# Patient Record
Sex: Male | Born: 1970 | Race: Black or African American | Hispanic: No | Marital: Married | Smoking: Current some day smoker
Health system: Southern US, Community
[De-identification: ages and names within clinical notes are randomized; demographics above are authoritative.]

## PROBLEM LIST (undated history)

## (undated) DIAGNOSIS — I10 Essential (primary) hypertension: Secondary | ICD-10-CM

---

## 1993-01-25 HISTORY — PX: HAND SURGERY: SHX662

## 2003-01-26 HISTORY — PX: ACHILLES TENDON SURGERY: SHX542

## 2020-02-01 ENCOUNTER — Other Ambulatory Visit: Payer: Self-pay

## 2020-02-01 ENCOUNTER — Emergency Department: Payer: BC Managed Care – PPO

## 2020-02-01 ENCOUNTER — Encounter: Payer: Self-pay | Admitting: Emergency Medicine

## 2020-02-01 ENCOUNTER — Inpatient Hospital Stay
Admission: EM | Admit: 2020-02-01 | Discharge: 2020-02-12 | DRG: 177 | Disposition: A | Discharge status: Home / Self Care | Payer: BC Managed Care – PPO | Attending: Internal Medicine | Admitting: Internal Medicine

## 2020-02-01 DIAGNOSIS — R7401 Elevation of levels of liver transaminase levels: Secondary | ICD-10-CM | POA: Diagnosis present

## 2020-02-01 DIAGNOSIS — E875 Hyperkalemia: Secondary | ICD-10-CM | POA: Diagnosis present

## 2020-02-01 DIAGNOSIS — J9601 Acute respiratory failure with hypoxia: Secondary | ICD-10-CM | POA: Diagnosis present

## 2020-02-01 DIAGNOSIS — D72819 Decreased white blood cell count, unspecified: Secondary | ICD-10-CM | POA: Diagnosis present

## 2020-02-01 DIAGNOSIS — U071 COVID-19: Principal | ICD-10-CM

## 2020-02-01 DIAGNOSIS — A0839 Other viral enteritis: Secondary | ICD-10-CM | POA: Diagnosis present

## 2020-02-01 DIAGNOSIS — N179 Acute kidney failure, unspecified: Secondary | ICD-10-CM | POA: Diagnosis present

## 2020-02-01 DIAGNOSIS — T380X5A Adverse effect of glucocorticoids and synthetic analogues, initial encounter: Secondary | ICD-10-CM | POA: Diagnosis present

## 2020-02-01 DIAGNOSIS — I10 Essential (primary) hypertension: Secondary | ICD-10-CM | POA: Diagnosis present

## 2020-02-01 DIAGNOSIS — J1282 Pneumonia due to coronavirus disease 2019: Secondary | ICD-10-CM | POA: Diagnosis present

## 2020-02-01 DIAGNOSIS — E871 Hypo-osmolality and hyponatremia: Secondary | ICD-10-CM | POA: Diagnosis present

## 2020-02-01 DIAGNOSIS — E663 Overweight: Secondary | ICD-10-CM | POA: Diagnosis present

## 2020-02-01 DIAGNOSIS — Z6829 Body mass index (BMI) 29.0-29.9, adult: Secondary | ICD-10-CM

## 2020-02-01 DIAGNOSIS — R7303 Prediabetes: Secondary | ICD-10-CM | POA: Diagnosis present

## 2020-02-01 HISTORY — DX: Essential (primary) hypertension: I10

## 2020-02-01 LAB — CBC WITH DIFFERENTIAL/PLATELET
Abs Immature Granulocytes: 0.02 10*3/uL (ref 0.00–0.07)
Basophils Absolute: 0 10*3/uL (ref 0.0–0.1)
Basophils Relative: 0 %
Eosinophils Absolute: 0 10*3/uL (ref 0.0–0.5)
Eosinophils Relative: 0 %
HCT: 43.4 % (ref 39.0–52.0)
Hemoglobin: 14.1 g/dL (ref 13.0–17.0)
Immature Granulocytes: 0 %
Lymphocytes Relative: 24 %
Lymphs Abs: 1.7 10*3/uL (ref 0.7–4.0)
MCH: 29.9 pg (ref 26.0–34.0)
MCHC: 32.5 g/dL (ref 30.0–36.0)
MCV: 92.1 fL (ref 80.0–100.0)
Monocytes Absolute: 0.4 10*3/uL (ref 0.1–1.0)
Monocytes Relative: 6 %
Neutro Abs: 4.9 10*3/uL (ref 1.7–7.7)
Neutrophils Relative %: 70 %
Platelets: 224 10*3/uL (ref 150–400)
RBC: 4.71 MIL/uL (ref 4.22–5.81)
RDW: 13.6 % (ref 11.5–15.5)
WBC: 7 10*3/uL (ref 4.0–10.5)
nRBC: 0 % (ref 0.0–0.2)

## 2020-02-01 LAB — COMPREHENSIVE METABOLIC PANEL
ALT: 42 U/L (ref 0–44)
AST: 63 U/L — ABNORMAL HIGH (ref 15–41)
Albumin: 3.5 g/dL (ref 3.5–5.0)
Alkaline Phosphatase: 58 U/L (ref 38–126)
Anion gap: 11 (ref 5–15)
BUN: 17 mg/dL (ref 6–20)
CO2: 26 mmol/L (ref 22–32)
Calcium: 8.5 mg/dL — ABNORMAL LOW (ref 8.9–10.3)
Chloride: 97 mmol/L — ABNORMAL LOW (ref 98–111)
Creatinine, Ser: 1.38 mg/dL — ABNORMAL HIGH (ref 0.61–1.24)
GFR, Estimated: >60 mL/min (ref >60)
Glucose, Bld: 135 mg/dL — ABNORMAL HIGH (ref 70–99)
Potassium: 4.3 mmol/L (ref 3.5–5.1)
Sodium: 134 mmol/L — ABNORMAL LOW (ref 135–145)
Total Bilirubin: 0.7 mg/dL (ref 0.3–1.2)
Total Protein: 7.6 g/dL (ref 6.5–8.1)

## 2020-02-01 LAB — LACTIC ACID, PLASMA: Lactic Acid, Venous: 1.1 mmol/L (ref 0.5–1.9)

## 2020-02-01 LAB — FERRITIN: Ferritin: 790 ng/mL — ABNORMAL HIGH (ref 24–336)

## 2020-02-01 LAB — FIBRINOGEN: Fibrinogen: 623 mg/dL — ABNORMAL HIGH (ref 210–475)

## 2020-02-01 LAB — TRIGLYCERIDES: Triglycerides: 110 mg/dL (ref <150)

## 2020-02-01 LAB — PROCALCITONIN: Procalcitonin: 0.42 ng/mL

## 2020-02-01 LAB — LACTATE DEHYDROGENASE: LDH: 438 U/L — ABNORMAL HIGH (ref 98–192)

## 2020-02-01 MED ORDER — SODIUM CHLORIDE 0.9 % IV SOLN: INTRAVENOUS | Status: DC

## 2020-02-01 MED ORDER — ACETAMINOPHEN 325 MG PO TABS: 650.0000 mg | ORAL_TABLET | Freq: Four times a day (QID) | ORAL | Status: DC | PRN

## 2020-02-01 MED ORDER — DEXAMETHASONE SODIUM PHOSPHATE 10 MG/ML IJ SOLN
8.0000 mg | Freq: Once | INTRAMUSCULAR | Status: AC
  Administered 2020-02-01: 8 mg via INTRAVENOUS
  Filled 2020-02-01: qty 1

## 2020-02-01 MED ORDER — SODIUM CHLORIDE 0.9 % IV SOLN
200.0000 mg | Freq: Once | INTRAVENOUS | Status: AC
  Administered 2020-02-01: 200 mg via INTRAVENOUS
  Filled 2020-02-01: qty 200

## 2020-02-01 MED ORDER — SODIUM CHLORIDE 0.9 % IV SOLN
100.0000 mg | Freq: Every day | INTRAVENOUS | Status: AC
  Administered 2020-02-02 – 2020-02-04 (×4): 100 mg via INTRAVENOUS
  Filled 2020-02-01: qty 20
  Filled 2020-02-01: qty 100
  Filled 2020-02-01 (×2): qty 20

## 2020-02-01 MED ORDER — DEXAMETHASONE 6 MG PO TABS
6.0000 mg | ORAL_TABLET | ORAL | Status: DC
  Administered 2020-02-02: 6 mg via ORAL
  Filled 2020-02-01 (×2): qty 1

## 2020-02-01 MED ORDER — ENOXAPARIN SODIUM 40 MG/0.4ML ~~LOC~~ SOLN
40.0000 mg | SUBCUTANEOUS | Status: DC
  Administered 2020-02-01 – 2020-02-11 (×11): 40 mg via SUBCUTANEOUS
  Filled 2020-02-01 (×11): qty 0.4

## 2020-02-01 NOTE — ED Notes (Signed)
Patient stated he started having symptoms on Saturday and they were sore eyes, patient [facility] and tested positive for COVID. Patient then started feeling [facility] and came to ER today Patient is perspiring and is showing some shortness of breath  Patient A&O x 4 patient is appropriate and cooperative.

## 2020-02-01 NOTE — ED Notes (Signed)
This nurse and other nurse unable to draw ordered labs. One set of blood cultures drawn successfully. Will notify EDP and put IV team consult.

## 2020-02-01 NOTE — ED Triage Notes (Signed)
Pt to ED via [facility] from home for shortness of breath. Pt states that he was diagnosed with COVID on Monday. Pt reports that he was having difficulty breathing yesterday but got worse last night. Pt called EMS this morning. Pt is currently on 2 liters of O2 via [place]. Pt states that he has had episodes of syncope due to his breathing. Pt is currently in NAD and able to speak in complete sentences at this time.

## 2020-02-01 NOTE — ED Notes (Signed)
This RN and Barbee Cough, RN are attempting to draw patients labs, patient appears to be very dehydrated

## 2020-02-01 NOTE — ED Provider Notes (Signed)
[facility] Emergency Department Provider Note   ____________________________________________    I have reviewed the triage vital signs and the nursing notes.   HISTORY  Chief Complaint Shortness of Breath and Covid Positive     HPI Jason Cowan is a 50 y.o. male who presents with complaints of shortness of breath.  Patient reports he has been diagnosed with COVID-19.  He has been feeling ill for approximately 7 to 8 days.  He is unvaccinated.  He reports over the last several days he has started to feel short of breath.  Markedly short of breath today, EMS reports he was hypoxic and started him on 2 L nasal cannula with significant improvement.  Patient has not had antibody infusion.  Past Medical History:  Diagnosis Date  . Hypertension     There are no problems to display for this patient.   History reviewed. No pertinent surgical history.  Prior to Admission medications   Not on File     Allergies Patient has no known allergies.  No family history on file.  Social History Social History   Tobacco Use  . Smoking status: Current Every Day Smoker    Types: Cigars  . Smokeless tobacco: Never Used  Substance Use Topics  . Alcohol use: Yes  . Drug use: Not Currently    Review of Systems  Constitutional: Positive fever Eyes: No visual changes.  ENT: No sore throat. Cardiovascular: Denies chest pain. Respiratory: As above. Gastrointestinal: No abdominal pain.  No nausea, no vomiting.   Genitourinary: Negative for dysuria. Musculoskeletal: Myalgias Skin: Negative for rash. Neurological: Negative for headaches or weakness   ____________________________________________   PHYSICAL EXAM:  VITAL SIGNS: ED Triage Vitals  Enc Vitals Group     BP 02/01/20 1124 112/79     Pulse Rate 02/01/20 1124 85     Resp 02/01/20 1124 18     Temp 02/01/20 1124 100.1 F (37.8 C)     Temp src --      SpO2 02/01/20 1124 99 %      Weight 02/01/20 1135 81.6 kg (180 lb)     Height 02/01/20 1135 1.676 m (5\' 6" )     Head Circumference --      Peak Flow --      Pain Score 02/01/20 1134 7     Pain Loc --      Pain Edu? --      Excl. in GC? --     Constitutional: Alert and oriented. No acute distress. Eyes: Conjunctivae are normal.  Head: Atraumatic. Nose: No congestion/rhinnorhea. Mouth/Throat: Mucous membranes are moist.    Cardiovascular: Normal rate, regular rhythm. Grossly normal heart sounds.  Good peripheral circulation. Respiratory: Increased respiratory effort with tachypnea, no retractions, bibasilar Rales Gastrointestinal: Soft and nontender. No distention.  N  Musculoskeletal: No lower extremity tenderness nor edema.  Warm and well perfused Neurologic:  Normal speech and language. No gross focal neurologic deficits are appreciated.  Skin:  Skin is warm, dry and intact. No rash noted. Psychiatric: Mood and affect are normal. Speech and behavior are normal.  ____________________________________________   LABS (all labs ordered are listed, but only abnormal results are displayed)  Labs Reviewed  COMPREHENSIVE METABOLIC PANEL - Abnormal; Notable for the following components:      Result Value   Sodium 134 (*)    Chloride 97 (*)    Glucose, Bld 135 (*)    Creatinine, Ser 1.38 (*)    Calcium  8.5 (*)    AST 63 (*)    All other components within normal limits  CULTURE, BLOOD (ROUTINE X 2)  CULTURE, BLOOD (ROUTINE X 2)  CBC WITH DIFFERENTIAL/PLATELET  LACTIC ACID, PLASMA  LACTIC ACID, PLASMA  FIBRIN DERIVATIVES D-DIMER ([facility] ONLY)  PROCALCITONIN  LACTATE DEHYDROGENASE  FERRITIN  TRIGLYCERIDES  FIBRINOGEN  C-REACTIVE PROTEIN   ____________________________________________  EKG  ED ECG REPORT I, Jene Every, the attending physician, personally viewed and interpreted this ECG.  Date: 02/01/2020  Rhythm: normal sinus rhythm QRS Axis: normal Intervals: normal ST/T Wave abnormalities:  normal Narrative Interpretation: no evidence of acute ischemia  ____________________________________________  RADIOLOGY  Chest x-ray reviewed by me, multifocal pneumonia ____________________________________________   PROCEDURES  Procedure(s) performed: No  Procedures   Critical Care performed: yes  CRITICAL CARE Performed by: Jene Every   Total critical care time: 30 minutes  Critical care time was exclusive of separately billable procedures and treating other patients.  Critical care was necessary to treat or prevent imminent or life-threatening deterioration.  Critical care was time spent personally by me on the following activities: development of treatment plan with patient and/or surrogate as well as nursing, discussions with consultants, evaluation of patient's response to treatment, examination of patient, obtaining history from patient or surrogate, ordering and performing treatments and interventions, ordering and review of laboratory studies, ordering and review of radiographic studies, pulse oximetry and re-evaluation of patient's condition.  ____________________________________________   INITIAL IMPRESSION / ASSESSMENT AND PLAN / ED COURSE  Pertinent labs & imaging results that were available during my care of the patient were reviewed by me and considered in my medical decision making (see chart for details).  Patient presents with shortness of breath, hypoxia with known COVID-19 infection.  Highly suspicious for Covid pneumonia.  No pleurisy to suggest PE, no calf pain or swelling  Lab work demonstrates mild hyponatremia otherwise unremarkable.  X-ray consistent with multifocal pneumonia consistent with COVID-19 pneumonia  Patient is requiring oxygen.  We will give IV Decadron, admit to the hospitalist service    ____________________________________________   FINAL CLINICAL IMPRESSION(S) / ED DIAGNOSES  Final diagnoses:  Acute hypoxemic  respiratory failure due to COVID-19 [facility])        Note:  This document was prepared using Dragon voice recognition software and may include unintentional dictation errors.   Jene Every, MD 02/01/20 1434

## 2020-02-01 NOTE — Consult Note (Signed)
Remdesivir - Pharmacy Brief Note   O:  ALT: 42 (02/01/20) CXR: Moderate severity bilateral viral COVID pneumonia. SpO2: 96% on 2L [place]   A/P:  Remdesivir 200 mg IVPB once followed by 100 mg IVPB daily x 4 days.   Martyn Malay, [facility] 02/01/2020 3:44 PM

## 2020-02-01 NOTE — H&P (Addendum)
History and Physical    Jason Cowan VWP:[id number] DOB: [birth year] DOA: 02/01/2020  PCP: No primary care provider on file.  Patient coming from: home   Chief Complaint: shortness of breat  HPI: Jason Cowan is a 50 y.o. male with medical history significant for htn presenting w/ the above.  Unvaccinated  Symptoms began 1/1.  Initially eye pain and headache. Progressed to body aches, cough, and loose stools. For past few days increasing shortness of breath which prompted him to come here. No vomiting, is tolerating liquids. No leg swelling or chest pain. No abdominal pain. Occasional cigar use, otherwise denies toxic habits. Denies wheeze or history asthma/copd. [facility] (results visible in care everywhere)  ED Course:   EMS reports O2 of 87%, improved to normal w/ 2 L. Labs drawn, decadron ordered. CXR consistent w/ covid pneumonia.   Review of Systems: As per HPI otherwise 10 point review of systems negative.    Past Medical History:  Diagnosis Date  . Hypertension     History reviewed. No pertinent surgical history.   reports that he has been smoking cigars. He has never used smokeless tobacco. He reports current alcohol use. He reports previous drug use.  No Known Allergies  No family history on file.  Prior to Admission medications   Not on File    Physical Exam: Vitals:   02/01/20 1124 02/01/20 1135  BP: 112/79   Pulse: 85   Resp: 18   Temp: 100.1 F (37.8 C)   SpO2: 99%   Weight:  81.6 kg  Height:  5\' 6"  (1.676 m)    Constitutional: No acute distress Head: Atraumatic Eyes: Conjunctiva clear ENM: Moist mucous membranes. Normal dentition.  Neck: Supple Respiratory: Clear to auscultation bilaterally, no wheezing/rales/rhonchi. Normal respiratory effort. No accessory muscle use. . Cardiovascular: Regular rate and rhythm. No murmurs/rubs/gallops. Abdomen: Non-tender, non-distended. No masses. No rebound or  guarding. Positive bowel sounds. Musculoskeletal: No joint deformity upper and lower extremities. Normal ROM, no contractures. Normal muscle tone.  Skin: No rashes, lesions, or ulcers.  Extremities: No peripheral edema. Palpable peripheral pulses. Neurologic: Alert, moving all 4 extremities. Psychiatric: Normal insight and judgement.   Labs on Admission: I have personally reviewed following labs and imaging studies  CBC: Recent Labs  Lab 02/01/20 1147  WBC 7.0  NEUTROABS 4.9  HGB 14.1  HCT 43.4  MCV 92.1  PLT 224   Basic Metabolic Panel: Recent Labs  Lab 02/01/20 1147  NA 134*  K 4.3  CL 97*  CO2 26  GLUCOSE 135*  BUN 17  CREATININE 1.38*  CALCIUM 8.5*   GFR: Estimated Creatinine Clearance: 64.2 mL/min (A) (by C-G formula based on SCr of 1.38 mg/dL (H)). Liver Function Tests: Recent Labs  Lab 02/01/20 1147  AST 63*  ALT 42  ALKPHOS 58  BILITOT 0.7  PROT 7.6  ALBUMIN 3.5   No results for input(s): LIPASE, AMYLASE in the last 168 hours. No results for input(s): AMMONIA in the last 168 hours. Coagulation Profile: No results for input(s): INR, PROTIME in the last 168 hours. Cardiac Enzymes: No results for input(s): CKTOTAL, CKMB, CKMBINDEX, TROPONINI in the last 168 hours. BNP (last 3 results) No results for input(s): PROBNP in the last 8760 hours. HbA1C: No results for input(s): HGBA1C in the last 72 hours. CBG: No results for input(s): GLUCAP in the last 168 hours. Lipid Profile: No results for input(s): CHOL, HDL, LDLCALC, TRIG, CHOLHDL, LDLDIRECT in the last  72 hours. Thyroid Function Tests: No results for input(s): TSH, T4TOTAL, FREET4, T3FREE, THYROIDAB in the last 72 hours. Anemia Panel: No results for input(s): VITAMINB12, FOLATE, FERRITIN, TIBC, IRON, RETICCTPCT in the last 72 hours. Urine analysis: No results found for: COLORURINE, APPEARANCEUR, LABSPEC, PHURINE, GLUCOSEU, HGBUR, BILIRUBINUR, KETONESUR, PROTEINUR, UROBILINOGEN, NITRITE,  LEUKOCYTESUR  Radiological Exams on Admission: DG Chest 2 View  Result Date: 02/01/2020 CLINICAL DATA:  Short of breath.  COVID infection EXAM: CHEST - 2 VIEW COMPARISON:  None. FINDINGS: Normal cardiac silhouette. Bilateral patchy airspace disease with a lower lobe predominance. No pleural fluid. No pneumothorax. IMPRESSION: Moderate severity bilateral viral COVID pneumonia. Electronically Signed   By: Genevive Bi M.D.   On: 02/01/2020 12:28    EKG: Independently reviewed. nsr  Assessment/Plan Principal Problem:   Acute hypoxemic respiratory failure due to COVID-19 [facility]) Active Problems:   Pneumonia due to COVID-19 virus   HTN (hypertension)   # Covid pneumonia # Acute hypoxic respiratory failure # Elevated AST O2 87% via ems, normalized with 2 L [place]. CXR shows b/l infiltrates consistent w/ covid pneumonia. Labs show mild ast elevation, likely 2/2 acute illness. Hemodynamically stable - decadron and remdesivir ordered - daily inflammatory markers - pulmonary toilet  # Elevated Creatinine 1.15, last available result 1.15 in 2017. Thus not clear if this is aki or ckd, pt does have untreated hypertension. - will give NS @ 125 for 24 hours - trend  # HTN Here bp wnl, ill. Says not on meds at home, doesn't have a pcp - monitor  # Hyperglycemia Mild - f/u a1c  # health care maintenance Not followed by a PCP - check hiv, hcv  DVT prophylaxis: lovenox Code Status: full  Family Communication: friend Jason Cowan updated  Consults called: none    Status is: Inpatient  Remains inpatient appropriate because:severity of illness   Dispo: The patient is from: Home              Anticipated d/c is to: Home              Anticipated d/c date is: 1-3 days              Patient currently is not medically stable to d/c.        Silvano Bilis MD [facility] Pager [phone]  If 7PM-7AM, please contact night-coverage [email or web address] Password [facility]  02/01/2020, 2:53 PM

## 2020-02-02 DIAGNOSIS — J9601 Acute respiratory failure with hypoxia: Secondary | ICD-10-CM | POA: Diagnosis not present

## 2020-02-02 DIAGNOSIS — U071 COVID-19: Secondary | ICD-10-CM | POA: Diagnosis not present

## 2020-02-02 LAB — COMPREHENSIVE METABOLIC PANEL
ALT: 40 U/L (ref 0–44)
AST: 49 U/L — ABNORMAL HIGH (ref 15–41)
Albumin: 3.2 g/dL — ABNORMAL LOW (ref 3.5–5.0)
Alkaline Phosphatase: 58 U/L (ref 38–126)
Anion gap: 11 (ref 5–15)
BUN: 20 mg/dL (ref 6–20)
CO2: 24 mmol/L (ref 22–32)
Calcium: 8.4 mg/dL — ABNORMAL LOW (ref 8.9–10.3)
Chloride: 103 mmol/L (ref 98–111)
Creatinine, Ser: 0.92 mg/dL (ref 0.61–1.24)
GFR, Estimated: >60 mL/min (ref >60)
Glucose, Bld: 154 mg/dL — ABNORMAL HIGH (ref 70–99)
Potassium: 4.7 mmol/L (ref 3.5–5.1)
Sodium: 138 mmol/L (ref 135–145)
Total Bilirubin: 0.6 mg/dL (ref 0.3–1.2)
Total Protein: 7.8 g/dL (ref 6.5–8.1)

## 2020-02-02 LAB — FERRITIN: Ferritin: 874 ng/mL — ABNORMAL HIGH (ref 24–336)

## 2020-02-02 LAB — HEPATITIS C ANTIBODY: HCV Ab: NONREACTIVE

## 2020-02-02 LAB — CBC WITH DIFFERENTIAL/PLATELET
Abs Immature Granulocytes: 0.01 10*3/uL (ref 0.00–0.07)
Basophils Absolute: 0 10*3/uL (ref 0.0–0.1)
Basophils Relative: 0 %
Eosinophils Absolute: 0 10*3/uL (ref 0.0–0.5)
Eosinophils Relative: 0 %
HCT: 46.2 % (ref 39.0–52.0)
Hemoglobin: 14.8 g/dL (ref 13.0–17.0)
Immature Granulocytes: 0 %
Lymphocytes Relative: 40 %
Lymphs Abs: 1.1 10*3/uL (ref 0.7–4.0)
MCH: 29.8 pg (ref 26.0–34.0)
MCHC: 32 g/dL (ref 30.0–36.0)
MCV: 93 fL (ref 80.0–100.0)
Monocytes Absolute: 0.2 10*3/uL (ref 0.1–1.0)
Monocytes Relative: 7 %
Neutro Abs: 1.5 10*3/uL — ABNORMAL LOW (ref 1.7–7.7)
Neutrophils Relative %: 53 %
Platelets: 247 10*3/uL (ref 150–400)
RBC: 4.97 MIL/uL (ref 4.22–5.81)
RDW: 14.1 % (ref 11.5–15.5)
Smear Review: NORMAL
WBC: 2.8 10*3/uL — ABNORMAL LOW (ref 4.0–10.5)
nRBC: 0 % (ref 0.0–0.2)

## 2020-02-02 LAB — C-REACTIVE PROTEIN
CRP: 9.5 mg/dL — ABNORMAL HIGH (ref <1.0)
CRP: 9.6 mg/dL — ABNORMAL HIGH (ref <1.0)

## 2020-02-02 LAB — HIV ANTIBODY (ROUTINE TESTING W REFLEX): HIV Screen 4th Generation wRfx: NONREACTIVE

## 2020-02-02 LAB — MAGNESIUM: Magnesium: 2.7 mg/dL — ABNORMAL HIGH (ref 1.7–2.4)

## 2020-02-02 LAB — HEMOGLOBIN A1C
Hgb A1c MFr Bld: 6.3 % — ABNORMAL HIGH (ref 4.8–5.6)
Mean Plasma Glucose: 134.11 mg/dL

## 2020-02-02 LAB — D-DIMER, QUANTITATIVE
D-Dimer, Quant: 1.63 ug/mL-FEU — ABNORMAL HIGH (ref 0.00–0.50)
D-Dimer, Quant: 1.42 ug/mL-FEU — ABNORMAL HIGH (ref 0.00–0.50)

## 2020-02-02 LAB — PHOSPHORUS: Phosphorus: 3 mg/dL (ref 2.5–4.6)

## 2020-02-02 MED ORDER — GUAIFENESIN ER 600 MG PO TB12: 600.0000 mg | ORAL_TABLET | Freq: Two times a day (BID) | ORAL | Status: DC

## 2020-02-02 MED ORDER — METHYLPREDNISOLONE SODIUM SUCC 125 MG IJ SOLR
80.0000 mg | Freq: Two times a day (BID) | INTRAMUSCULAR | Status: DC
  Administered 2020-02-02 – 2020-02-08 (×12): 80 mg via INTRAVENOUS
  Filled 2020-02-02 (×13): qty 2

## 2020-02-02 MED ORDER — TOCILIZUMAB 400 MG/20ML IV SOLN
8.0000 mg/kg | Freq: Once | INTRAVENOUS | Status: AC
  Administered 2020-02-02: 652 mg via INTRAVENOUS
  Filled 2020-02-02: qty 32.6

## 2020-02-02 MED ORDER — GUAIFENESIN-DM 100-10 MG/5ML PO SYRP
15.0000 mL | ORAL_SOLUTION | ORAL | Status: DC | PRN
  Filled 2020-02-02: qty 15

## 2020-02-02 MED ORDER — METHYLPREDNISOLONE SODIUM SUCC 125 MG IJ SOLR: 80.0000 mg | Freq: Four times a day (QID) | INTRAMUSCULAR | Status: DC

## 2020-02-02 MED ORDER — GUAIFENESIN ER 600 MG PO TB12
600.0000 mg | ORAL_TABLET | Freq: Two times a day (BID) | ORAL | Status: DC
  Administered 2020-02-02 – 2020-02-12 (×20): 600 mg via ORAL
  Filled 2020-02-02 (×20): qty 1

## 2020-02-02 MED ORDER — SODIUM CHLORIDE 0.9 % IV SOLN: INTRAVENOUS | Status: AC

## 2020-02-02 MED ORDER — ALBUTEROL SULFATE HFA 108 (90 BASE) MCG/ACT IN AERS
2.0000 | INHALATION_SPRAY | Freq: Four times a day (QID) | RESPIRATORY_TRACT | Status: DC
  Administered 2020-02-03 – 2020-02-12 (×32): 2 via RESPIRATORY_TRACT
  Filled 2020-02-02 (×2): qty 6.7

## 2020-02-02 MED ORDER — METHYLPREDNISOLONE SODIUM SUCC 40 MG IJ SOLR
40.0000 mg | Freq: Two times a day (BID) | INTRAMUSCULAR | Status: DC
  Administered 2020-02-02: 40 mg via INTRAVENOUS
  Filled 2020-02-02: qty 1

## 2020-02-02 MED ORDER — IPRATROPIUM-ALBUTEROL 0.5-2.5 (3) MG/3ML IN SOLN: 3.0000 mL | RESPIRATORY_TRACT | Status: DC | PRN

## 2020-02-02 NOTE — ED Notes (Signed)
Pt still on 6L oxygen per [place]. Added humidification to oxygen. SPO2 continues to drop to 90% on 6L but is mostly remaining 94-97%.

## 2020-02-02 NOTE — ED Notes (Signed)
Turned oxygen from 3L to 2L. Attending at bedside.

## 2020-02-02 NOTE — ED Notes (Signed)
Pt coughing. Offered cough medicine, pt declined. Provided cool wet washcloth to pt forehead. Pt resting with eyes closed and listening to music on phone. Lights turned down.

## 2020-02-02 NOTE — ED Notes (Signed)
Pt desatted to 82%, still on 6L. Had been coughing. Pt remained in 80s, a few times was able to get back to 91-93% when concentrating to breathe deeply through nose but continued to desaturate into low 80s. Pt placed on nonrebreather mask on 15L, humidified. Pt currently satting 93%. Pt states concern why "PNA not being treated" and that he feels he needs an expectorant to help cough up mucous. Explained difference between viral PNA and bacterial PNA in terms of treatment/antibiotic use. Told pt this nurse will communicate concerns to attending MD.

## 2020-02-02 NOTE — ED Notes (Signed)
Communicated findings and pt concerns from previous note to attending provider, Dr Lanae Boast.

## 2020-02-02 NOTE — ED Notes (Signed)
Provided incentive spirometer for pt to use. Pt concerned about having covid PNA and stated to this nurse that a close friend of his recently died. Instructed pt on how to use IS.

## 2020-02-02 NOTE — ED Notes (Signed)
Messaged attendign re: high BP trends for this pt. Pt states has had high BP for a few years that he is aware of but does not take anything. Counseled pt re: importance of BP control.

## 2020-02-02 NOTE — Progress Notes (Addendum)
PROGRESS NOTE    Jason Cowan  ZES:[id number] DOB: Oct 14, 1970 DOA: 02/01/2020 PCP: Patient, No Pcp Per   Chief Complaint  Patient presents with  . Shortness of Breath  . Covid Positive   Brief Narrative: 50 year old male with history of hypertension, unvaccinated for COVID presented with multiple complaints of body aches cough  onset 1/1. Seen in the ED was hypoxic 87% on room air improved to normal with 2 L, COVID-19 positive and chest x-ray with pneumonia and patient was admitted.   Subjective: CRP pending c/o cough with breathing, short of breath still but some better. No chest pain No leg pain, no nauses or vomiting and tolerating po He is on 3L [place] and pusle ox at 95 at bedside   Assessment & Plan:  Acute hypoxemic respiratory failure due to COVID-19/COVID-19 pneumonia: Continue on remdesivir, change Decadron to Solu-Medrol as  need went from 3 to 6L this afternoon, cont bronchodilators supplemental oxygen.  Procalcitonin slightly up 0.4, MONITOR.Monitor inflammatory markers closely. COVID-19 Labs Recent Labs    02/01/20 2003 02/02/20 0447  DDIMER 1.63*  --   FERRITIN 790* 874*  LDH 438*  --   CRP 9.5*  --    Acute renal failure/Hyponatremia: Likely from volume depletion with diarrhea from COVID infection.  Renal function and sodium improved.  Continue gentle IV fluid hydration until able to take PO WELL.  Leukopenia monitor likely from COVID infection.  HTN: BP is well controlled.  Not on meds currently  Addendnium at 7 pm Patient o2 need continued to increase this evening, now on NRB. Iv steroid increased to  solumedro 80 mg q12hr. I also discussed with him that he has new onset DM given hba1c is elevated putting him at higher risk for covid pneumonia associaed morbidity/mortality. Lab Results  Component Value Date   HGBA1C 6.3 (H) 02/02/2020   I discussed with the patient about Actemra inj- risks  in detials including risk of sepsis, liver injury and  benefits of treating covid pneumonia and he agreed. procal is 0.4 and crp at 9.6.  Night RN NP alerted to monitor closely and low threshold for icu transfer.  I called his fiancee to update with patient's conset- no answer, left message to cal the nursing unit for updates.  The treatment plan and use of medications and known side effects were discussed with patient, they were clearly explained that there is no proven definitive treatment for COVID-19 infection, any medications used here are based on published clinical articles/anecdotal data which are not peer-reviewed or randomized control trials.  Complete risks and long-term side effects are unknown, however in the best clinical judgment they seem to be of some clinical benefit rather than medical risks.  Patient agree with the treatment plan and want to receive the given medications.   Nutrition: Diet Order            Diet regular Room service appropriate? Yes; Fluid consistency: Thin  Diet effective now                 Body mass index is 29.05 kg/m. DVT prophylaxis: enoxaparin (LOVENOX) injection 40 mg Start: 02/01/20 2200 Code Status:   Code Status: Full Code  Family Communication: plan of care discussed with patient at bedside.  Status is: Inpatient Remains inpatient appropriate because:IV treatments appropriate due to intensity of illness or inability to take PO and Inpatient level of care appropriate due to severity of illness  Dispo: The patient is from: Home  Anticipated d/c is to: Home              Anticipated d/c date is: 3 days              Patient currently is not medically stable to d/c.  Consultants:see note  Procedures:see note  Culture/Microbiology    Component Value Date/Time   SDES BLOOD BLOOD RIGHT HAND 02/01/2020 2003   SPECREQUEST  02/01/2020 2003    BOTTLES DRAWN AEROBIC AND ANAEROBIC Blood Culture adequate volume   CULT  02/01/2020 2003    NO GROWTH < 12 HOURS Performed at [facility], [address] [place], [place] [address]    REPTSTATUS PENDING 02/01/2020 2003    Other culture-see note  Medications: Scheduled Meds: . dexamethasone  6 mg Oral Q24H  . enoxaparin (LOVENOX) injection  40 mg Subcutaneous Q24H   Continuous Infusions: . sodium chloride 125 mL/hr at 02/02/20 1120  . remdesivir 100 mg in NS 100 mL Stopped (02/02/20 1127)    Antimicrobials: Anti-infectives (From admission, onward)   Start     Dose/Rate Route Frequency Ordered Stop   02/02/20 1000  remdesivir 100 mg in sodium chloride 0.9 % 100 mL IVPB       "Followed by" Linked Group Details   100 mg 200 mL/hr over 30 Minutes Intravenous Daily 02/01/20 1506 02/06/20 0959   02/01/20 1600  remdesivir 200 mg in sodium chloride 0.9% 250 mL IVPB       "Followed by" Linked Group Details   200 mg 580 mL/hr over 30 Minutes Intravenous Once 02/01/20 1506 02/01/20 2011     Objective: Vitals: Today's Vitals   02/02/20 1100 02/02/20 1300 02/02/20 1400 02/02/20 1420  BP:  (!) 151/105 (!) 153/104   Pulse: 78 77 80 85  Resp: (!) 22 (!) 24 (!) 24   Temp:      TempSrc:      SpO2: 98% 90% 90% 91%  Weight:      Height:      PainSc:        Intake/Output Summary (Last 24 hours) at 02/02/2020 1430 Last data filed at 02/02/2020 0402 Gross per 24 hour  Intake -  Output 700 ml  Net -700 ml   Filed Weights   02/01/20 1135  Weight: 81.6 kg   Weight change:   Intake/Output from previous day: 01/07 0701 - 01/08 0700 In: -  Out: 700 [Urine:700] Intake/Output this shift: No intake/output data recorded.  Examination: General exam: AAOx3, young male, able to speak in full sentence HEENT:Oral mucosa moist, Ear/Nose WNL grossly,dentition normal. Respiratory system: bilaterally crackles+,no wheezing or crackles,no use of accessory muscle, non tender. Cardiovascular system: S1 & S2 +, regular, No JVD. Gastrointestinal system: Abdomen soft, NT,ND, BS+. Nervous System:Alert, awake, moving  extremities and grossly nonfocal Extremities: No edema, distal peripheral pulses palpable.  Skin: No rashes,no icterus. MSK: Normal muscle bulk,tone, power  Data Reviewed: I have personally reviewed following labs and imaging studies CBC: Recent Labs  Lab 02/01/20 1147 02/02/20 0447  WBC 7.0 2.8*  NEUTROABS 4.9 1.5*  HGB 14.1 14.8  HCT 43.4 46.2  MCV 92.1 93.0  PLT 224 247   Basic Metabolic Panel: Recent Labs  Lab 02/01/20 1147 02/02/20 0447  NA 134* 138  K 4.3 4.7  CL 97* 103  CO2 26 24  GLUCOSE 135* 154*  BUN 17 20  CREATININE 1.38* 0.92  CALCIUM 8.5* 8.4*  MG  --  2.7*  PHOS  --  3.0  GFR: Estimated Creatinine Clearance: 96.3 mL/min (by C-G formula based on SCr of 0.92 mg/dL). Liver Function Tests: Recent Labs  Lab 02/01/20 1147 02/02/20 0447  AST 63* 49*  ALT 42 40  ALKPHOS 58 58  BILITOT 0.7 0.6  PROT 7.6 7.8  ALBUMIN 3.5 3.2*   No results for input(s): LIPASE, AMYLASE in the last 168 hours. No results for input(s): AMMONIA in the last 168 hours. Coagulation Profile: No results for input(s): INR, PROTIME in the last 168 hours. Cardiac Enzymes: No results for input(s): CKTOTAL, CKMB, CKMBINDEX, TROPONINI in the last 168 hours. BNP (last 3 results) No results for input(s): PROBNP in the last 8760 hours. HbA1C: Recent Labs    02/02/20 0447  HGBA1C 6.3*   CBG: No results for input(s): GLUCAP in the last 168 hours. Lipid Profile: Recent Labs    02/01/20 2003  TRIG 110   Thyroid Function Tests: No results for input(s): TSH, T4TOTAL, FREET4, T3FREE, THYROIDAB in the last 72 hours. Anemia Panel: Recent Labs    02/01/20 2003 02/02/20 0447  FERRITIN 790* 874*   Sepsis Labs: Recent Labs  Lab 02/01/20 2003  PROCALCITON 0.42  LATICACIDVEN 1.1    Recent Results (from the past 240 hour(s))  Blood Culture (routine x 2)     Status: None (Preliminary result)   Collection Time: 02/01/20  2:44 PM   Specimen: BLOOD  Result Value Ref Range  Status   Specimen Description BLOOD BLOOD LEFT ARM  Final   Special Requests   Final    BOTTLES DRAWN AEROBIC AND ANAEROBIC Blood Culture results may not be optimal due to an inadequate volume of blood received in culture bottles   Culture   Final    NO GROWTH < 24 HOURS Performed at [facility], [address], [place], [place] [address]    Report Status PENDING  Incomplete  Blood Culture (routine x 2)     Status: None (Preliminary result)   Collection Time: 02/01/20  8:03 PM   Specimen: BLOOD  Result Value Ref Range Status   Specimen Description BLOOD BLOOD RIGHT HAND  Final   Special Requests   Final    BOTTLES DRAWN AEROBIC AND ANAEROBIC Blood Culture adequate volume   Culture   Final    NO GROWTH < 12 HOURS Performed at [facility], [address], [place], [place] [address]    Report Status PENDING  Incomplete     Radiology Studies: DG Chest 2 View  Result Date: 02/01/2020 CLINICAL DATA:  Short of breath.  COVID infection EXAM: CHEST - 2 VIEW COMPARISON:  None. FINDINGS: Normal cardiac silhouette. Bilateral patchy airspace disease with a lower lobe predominance. No pleural fluid. No pneumothorax. IMPRESSION: Moderate severity bilateral viral COVID pneumonia. Electronically Signed   By: Genevive Bi M.D.   On: 02/01/2020 12:28     LOS: 1 day   Lanae Boast, MD [facility]  02/02/2020, 2:30 PM

## 2020-02-02 NOTE — ED Notes (Addendum)
Pt lying awake in bed -- flat affect; oriented x4.  Pt tachypnic RR - pt on 15L o2 via NRB (provider aware and has discussed plan with patient to start on ABX Actemra and upgrade to ICU if patient O2 demands increase; pt agreeable with plan)  - lung sounds CTA to posterior bases with very mild crackles to LUL; no coughing noted at this time however pt reports intermittent nonprod cough.  Denies cp; cardiac monitoring maintained; NSR HR 75.  Abdomen soft nontender-- pt has decreased appetite but does take in oral fluids - pitcher of ice water at bedside at this time per pt request.  NS infusing @30ml /hr  to 20G L wrist; dressing dry and intact site free of complications.  Will monitor for acute changes and maintain plan of care.

## 2020-02-02 NOTE — ED Notes (Signed)
Pt removed nasal cannula in attempt to get up and walk to bathroom. Pt desatted to 78% on RA. Reapplied [place], pt was still satting at 80-85% on 3L. Turned up to 6L. Slowly came up into 90s. Currently 100%. Will slowly titrate down back to 3L. Pt sitting on side of bed.

## 2020-02-02 NOTE — ED Notes (Signed)
Messaged attending to discuss the possibility of keeping sodium chloride infusion at 135mL/hr order active. Changed BPO cuff to larger size.Previous BP cuff was too small. BP now reading 134/91.

## 2020-02-03 ENCOUNTER — Inpatient Hospital Stay: Payer: BC Managed Care – PPO

## 2020-02-03 ENCOUNTER — Encounter: Payer: Self-pay | Admitting: Obstetrics and Gynecology

## 2020-02-03 DIAGNOSIS — U071 COVID-19: Secondary | ICD-10-CM | POA: Diagnosis not present

## 2020-02-03 DIAGNOSIS — J9601 Acute respiratory failure with hypoxia: Secondary | ICD-10-CM | POA: Diagnosis not present

## 2020-02-03 LAB — PHOSPHORUS: Phosphorus: 2.7 mg/dL (ref 2.5–4.6)

## 2020-02-03 LAB — CBC WITH DIFFERENTIAL/PLATELET
Abs Immature Granulocytes: 0.05 10*3/uL (ref 0.00–0.07)
Basophils Absolute: 0 10*3/uL (ref 0.0–0.1)
Basophils Relative: 0 %
Eosinophils Absolute: 0 10*3/uL (ref 0.0–0.5)
Eosinophils Relative: 0 %
HCT: 43.4 % (ref 39.0–52.0)
Hemoglobin: 13.9 g/dL (ref 13.0–17.0)
Immature Granulocytes: 1 %
Lymphocytes Relative: 20 %
Lymphs Abs: 1.5 10*3/uL (ref 0.7–4.0)
MCH: 29.9 pg (ref 26.0–34.0)
MCHC: 32 g/dL (ref 30.0–36.0)
MCV: 93.3 fL (ref 80.0–100.0)
Monocytes Absolute: 0.5 10*3/uL (ref 0.1–1.0)
Monocytes Relative: 7 %
Neutro Abs: 5.4 10*3/uL (ref 1.7–7.7)
Neutrophils Relative %: 72 %
Platelets: 270 10*3/uL (ref 150–400)
RBC: 4.65 MIL/uL (ref 4.22–5.81)
RDW: 14.3 % (ref 11.5–15.5)
WBC: 7.5 10*3/uL (ref 4.0–10.5)
nRBC: 0 % (ref 0.0–0.2)

## 2020-02-03 LAB — COMPREHENSIVE METABOLIC PANEL
ALT: 43 U/L (ref 0–44)
AST: 45 U/L — ABNORMAL HIGH (ref 15–41)
Albumin: 3 g/dL — ABNORMAL LOW (ref 3.5–5.0)
Alkaline Phosphatase: 51 U/L (ref 38–126)
Anion gap: 9 (ref 5–15)
BUN: 25 mg/dL — ABNORMAL HIGH (ref 6–20)
CO2: 25 mmol/L (ref 22–32)
Calcium: 8.2 mg/dL — ABNORMAL LOW (ref 8.9–10.3)
Chloride: 104 mmol/L (ref 98–111)
Creatinine, Ser: 0.94 mg/dL (ref 0.61–1.24)
GFR, Estimated: >60 mL/min (ref >60)
Glucose, Bld: 158 mg/dL — ABNORMAL HIGH (ref 70–99)
Potassium: 4.9 mmol/L (ref 3.5–5.1)
Sodium: 138 mmol/L (ref 135–145)
Total Bilirubin: 0.7 mg/dL (ref 0.3–1.2)
Total Protein: 7.1 g/dL (ref 6.5–8.1)

## 2020-02-03 LAB — D-DIMER, QUANTITATIVE: D-Dimer, Quant: 1.08 ug/mL-FEU — ABNORMAL HIGH (ref 0.00–0.50)

## 2020-02-03 LAB — CBG MONITORING, ED: Glucose-Capillary: 134 mg/dL — ABNORMAL HIGH (ref 70–99)

## 2020-02-03 LAB — MAGNESIUM: Magnesium: 3.2 mg/dL — ABNORMAL HIGH (ref 1.7–2.4)

## 2020-02-03 LAB — FERRITIN: Ferritin: 741 ng/mL — ABNORMAL HIGH (ref 24–336)

## 2020-02-03 LAB — C-REACTIVE PROTEIN: CRP: 3.2 mg/dL — ABNORMAL HIGH (ref <1.0)

## 2020-02-03 IMAGING — CT CT ANGIO CHEST
2 of 6 series · 18 of 46 positions shown · IV contrast (omnipaque)
Comparison: None.

CLINICAL DATA: Shortness of breath.  COVID positive.

EXAM:
CT ANGIOGRAPHY CHEST WITH CONTRAST
TECHNIQUE: Multidetector CT imaging of the chest was performed using the
standard protocol during bolus administration of intravenous
contrast. Multiplanar CT image reconstructions and MIPs were
obtained to evaluate the vascular anatomy.
CONTRAST:  100mL OMNIPAQUE IOHEXOL 350 MG/ML SOLN

[Series 5: thins · axial · 0.65mm/px · z∈[-470,-229]mm · 16 of 265 slices shown]
[im 12/265  lung]
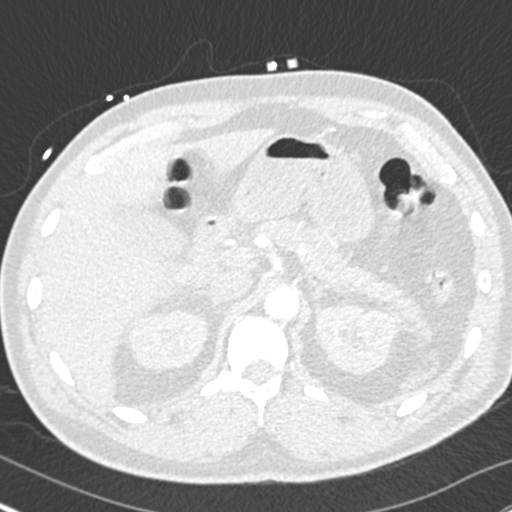
[im 35/265  soft-tissue]
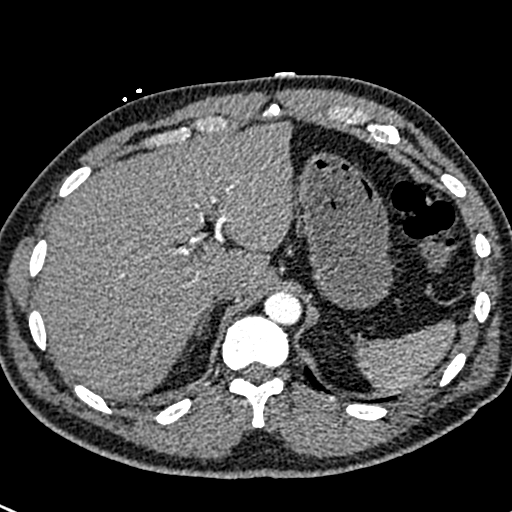
[im 46/265  lung]
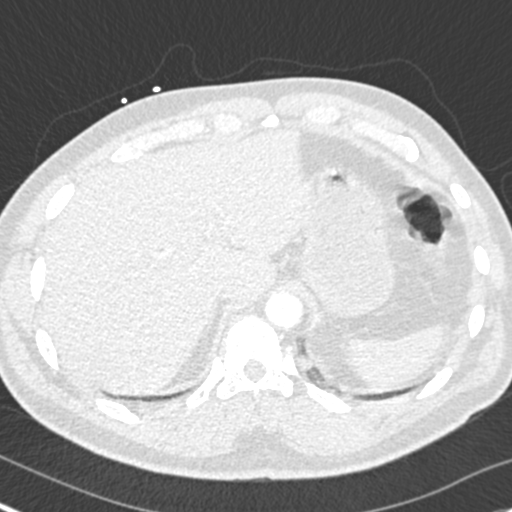
[im 58/265  soft-tissue]
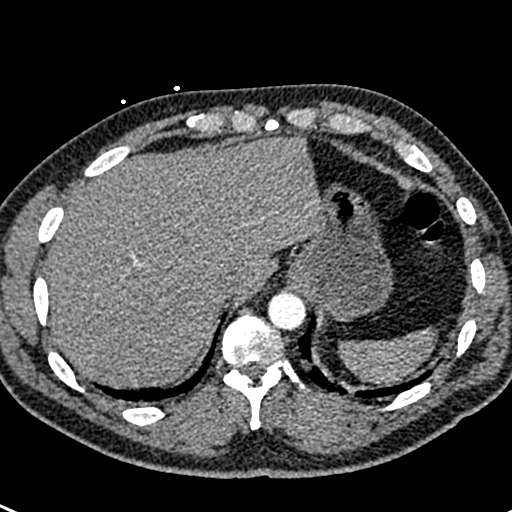
[im 81/265  lung]
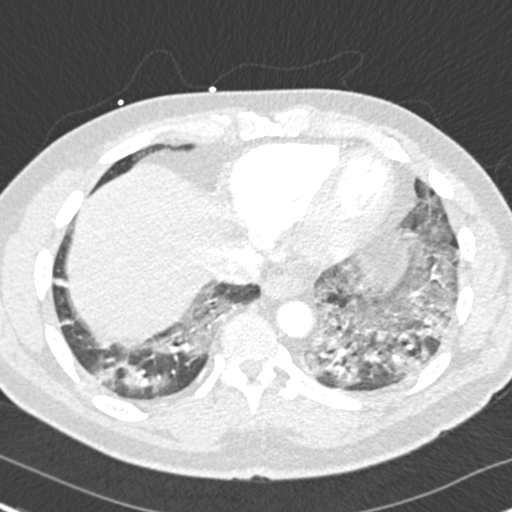
[im 92/265  soft-tissue]
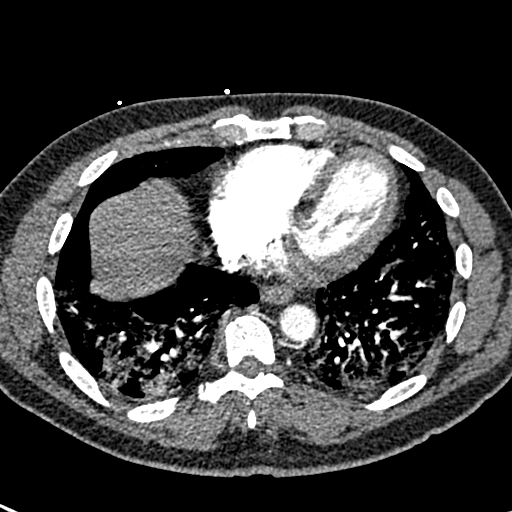
[im 104/265  lung]
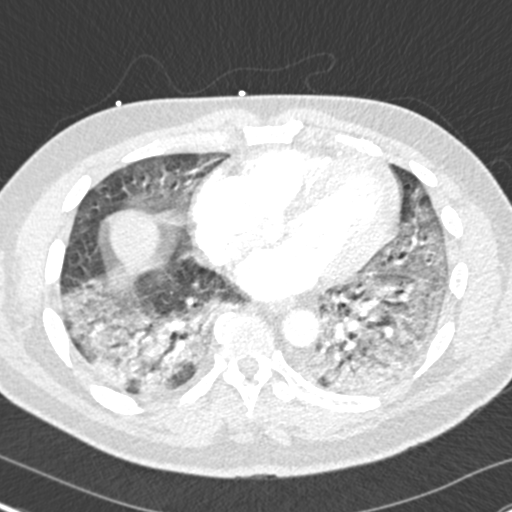
[im 127/265  soft-tissue]
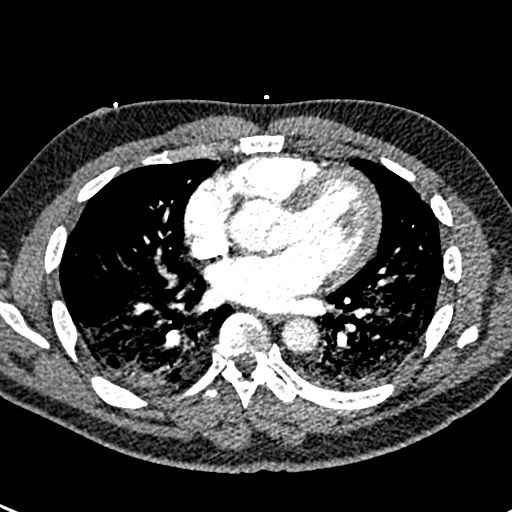
[im 138/265  lung]
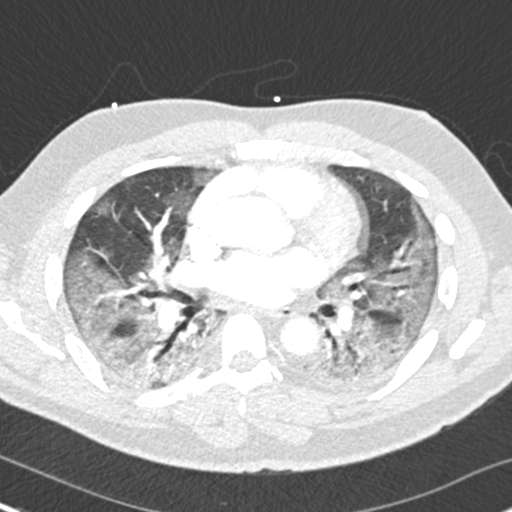
[im 161/265  soft-tissue]
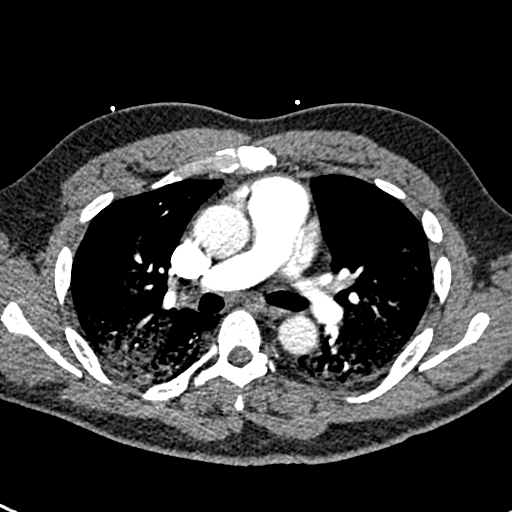
[im 173/265  lung]
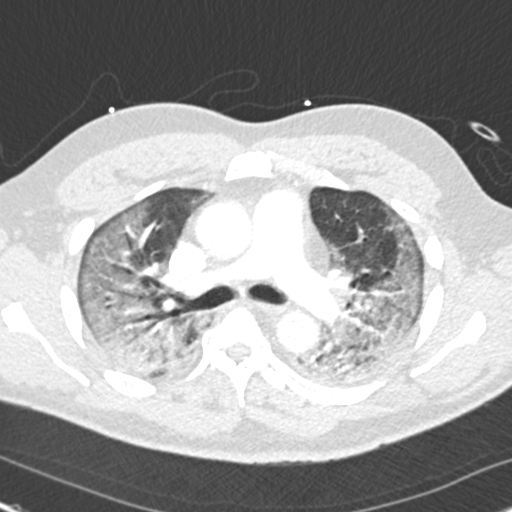
[im 184/265  soft-tissue]
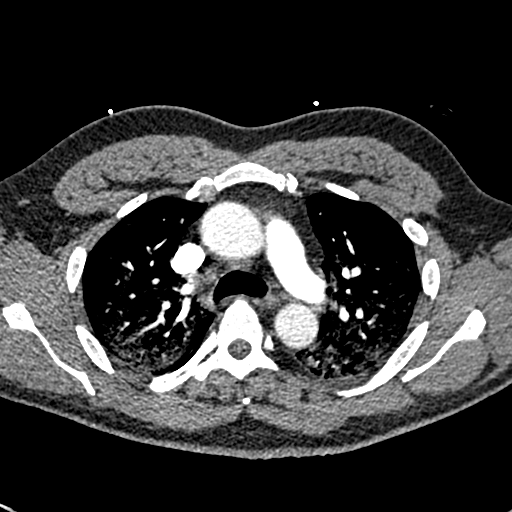
[im 207/265  lung]
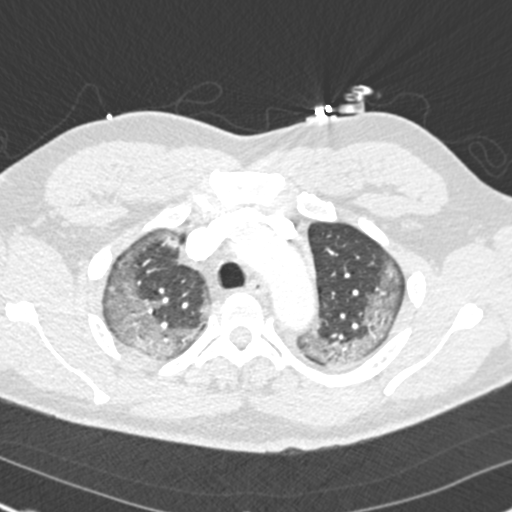
[im 219/265  soft-tissue]
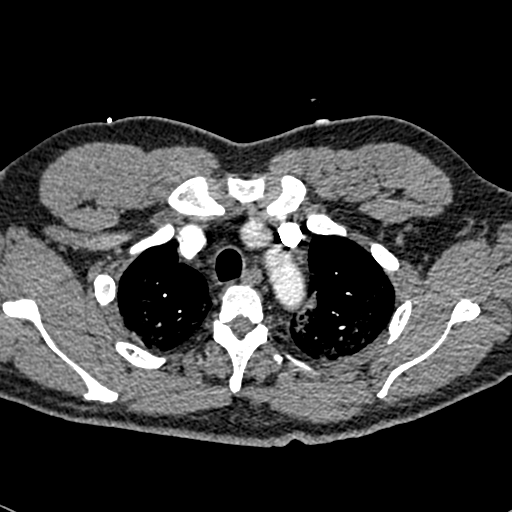
[im 230/265  lung]
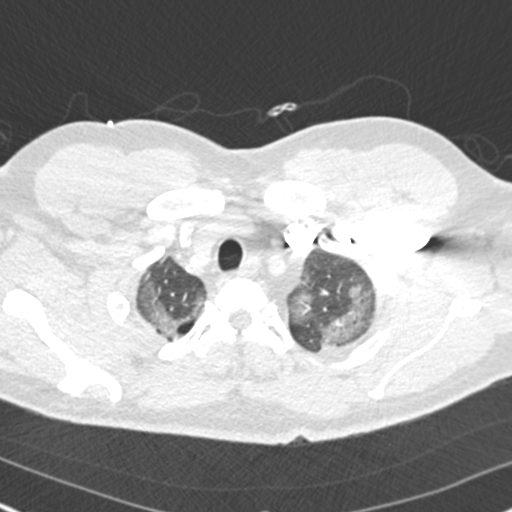
[im 253/265  soft-tissue]
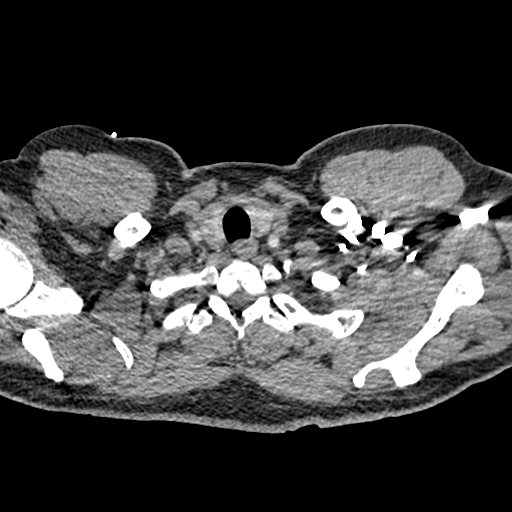

[Series 7: coronal mpr · coronal · 0.52mm/px · 2 of 84 slices shown]
[im 28/84  soft-tissue]
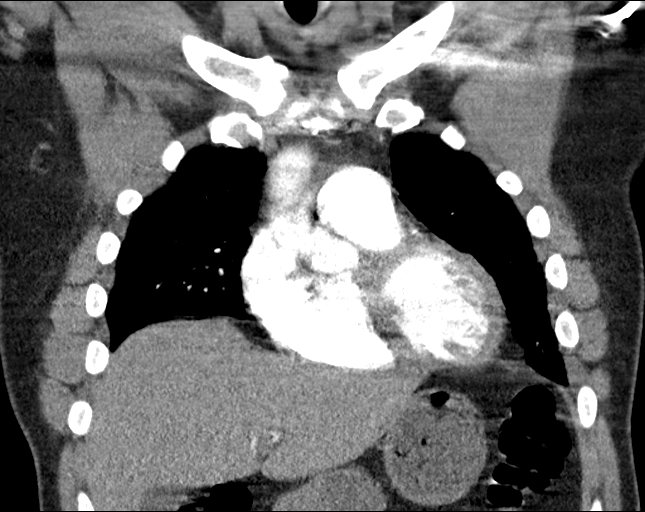
[im 56/84  soft-tissue]
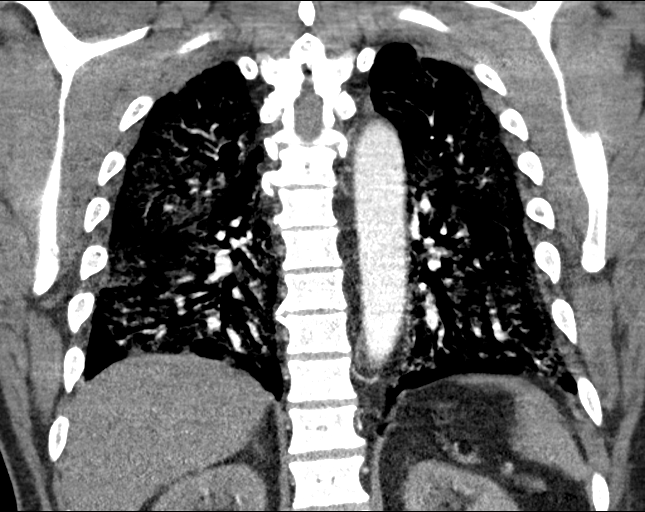

[18 of 46 positions shown; findings below may reference images not displayed]

FINDINGS: Cardiovascular: Evaluation is limited secondary to respiratory
motion, particularly in the bases. No pulmonary embolism through the
segmental pulmonary arteries. Heart is at the upper limits of normal
in size. LEFT-sided coronary artery atherosclerotic calcifications.

Mediastinum/Nodes: Thyroid is unremarkable. No axillary adenopathy.
No suspicious mediastinal adenopathy.

Lungs/Pleura: No pleural effusion or pneumothorax. There are diffuse
bilateral peripheral predominant ground-glass opacities which are
nearly completely confluent within the bilateral lower lobes.

Upper Abdomen: No acute abnormality.

Musculoskeletal: No acute osseous abnormality.

Review of the MIP images confirms the above findings.
IMPRESSION: 1. No evidence of acute pulmonary embolism through the segmental
pulmonary arteries.
2. Diffuse bilateral peripheral predominant ground-glass opacities
which are nearly completely confluent within the bilateral lower
lobes. Findings are consistent with [JS] pneumonia.
3. LEFT-sided coronary artery atherosclerotic calcifications.

Aortic Atherosclerosis ([JS]-[JS]).

## 2020-02-03 MED ORDER — INSULIN ASPART 100 UNIT/ML ~~LOC~~ SOLN
0.0000 [IU] | Freq: Three times a day (TID) | SUBCUTANEOUS | Status: DC
  Administered 2020-02-06: 1 [IU] via SUBCUTANEOUS
  Administered 2020-02-08 – 2020-02-10 (×5): 2 [IU] via SUBCUTANEOUS
  Administered 2020-02-10: 1 [IU] via SUBCUTANEOUS
  Administered 2020-02-10 – 2020-02-12 (×4): 2 [IU] via SUBCUTANEOUS
  Filled 2020-02-03 (×12): qty 1

## 2020-02-03 MED ORDER — IOHEXOL 350 MG/ML SOLN
100.0000 mL | Freq: Once | INTRAVENOUS | Status: AC | PRN
  Administered 2020-02-03: 100 mL via INTRAVENOUS
  Filled 2020-02-03: qty 100

## 2020-02-03 MED ORDER — SODIUM CHLORIDE 0.45 % IV SOLN: INTRAVENOUS | Status: DC

## 2020-02-03 NOTE — ED Notes (Addendum)
Phlebotomy to collect a.m. labs - spoke to lab tech via telephone [phone])who will draw labs this a.m.

## 2020-02-03 NOTE — Progress Notes (Addendum)
PROGRESS NOTE    Jason Cowan  RKY:[id number] DOB: Jan 03, 1971 DOA: 02/01/2020 PCP: Patient, No Pcp Per   Chief Complaint  Patient presents with  . Shortness of Breath  . Covid Positive   Brief Narrative: 50 year old male with history of hypertension, unvaccinated for COVID presented with multiple complaints of body aches cough  onset 1/1. Seen in the ED was hypoxic 87% on room air improved to normal with 2 L, COVID-19 positive and chest x-ray showed moderate severity bilateral viral pneumonia and patient was admitted.   Patient was on 3 to nasal cannula in the morning and oxygen demand significantly increased by evening to NRB on 1/8 and patient was given Actemra  Subjective:  Patient has been weaned overnight from nonrebreather to 6 L nasal cannula.   He feels about the same in terms of his breathing. He is going to think abt getting vaccine- has not made his mind yet. Cough is loosening up. Afebrile.   Assessment & Plan:  Acute hypoxemic respiratory failure due to COVID-19/COVID-19 pneumonia: Patient had rapidly progressing respiratory failure with needing nonrebreather by evening, was not tolerating nasal cannula morning.  Status post Actemra x1.  Patient is considered to be high risk given his rapidly worsening respiratory failure and new onset diabetes. He is weaned off from nonrebreather to 6 nasal cannula this morning.  Has ongoing tachypnea. Will obtain CT Angio chest to eval for PEin th esetting of covid. Keep on 0.5 ns gently to minimize nephrotoxicity. Continue IV Solu-Medrol, remdesivir, inhaler, antitussives and respiratory support.  Monitor inflammatory markers as below COVID-19 Labs Recent Labs    02/01/20 2003 02/02/20 0447  DDIMER 1.63* 1.42*  FERRITIN 790* 874*  LDH 438*  --   CRP 9.5* 9.6*   Acute renal failure/Hyponatremia: Likely from volume depletion with diarrhea from COVID infection.  Improved.  KEEP ON IVF.  Leukopenia WBC count improved overnight.  From COVID infection.  HTN: BP well controlled.Not on meds currently  New onset prediabetes hemoglobin A1c found to be 6.3.  Patient informed about the diagnosis.  Monitor CBG ,add ssi No results for input(s): GLUCAP in the last 168 hours.   Mild transaminitis from COVID-pneumonia  Overweight w/ BMI 29  The treatment plan and use of medications and known side effects were discussed with patient, they were clearly explained that there is no proven definitive treatment for COVID-19 infection, any medications used here are based on published clinical articles/anecdotal data which are not peer-reviewed or randomized control trials.  Complete risks and long-term side effects are unknown, however in the best clinical judgment they seem to be of some clinical benefit rather than medical risks.  Patient agree with the treatment plan and want to receive the given medications.   Nutrition: Diet Order            Diet regular Room service appropriate? Yes; Fluid consistency: Thin  Diet effective now                 Body mass index is 29.05 kg/m. DVT prophylaxis: enoxaparin (LOVENOX) injection 40 mg Start: 02/01/20 2200 Code Status:   Code Status: Full Code  Family Communication: plan of care discussed with patient at bedside.  Status is: Inpatient Remains inpatient appropriate because:IV treatments appropriate due to intensity of illness or inability to take PO and Inpatient level of care appropriate due to severity of illness  Dispo: The patient is from: Home  Anticipated d/c is to: Home              Anticipated d/c date is: 3 days              Patient currently is not medically stable to d/c.  Consultants:see note  Procedures:see note  Culture/Microbiology    Component Value Date/Time   SDES BLOOD BLOOD RIGHT HAND 02/01/2020 2003   SPECREQUEST  02/01/2020 2003    BOTTLES DRAWN AEROBIC AND ANAEROBIC Blood Culture adequate volume   CULT  02/01/2020 2003    NO GROWTH 2  DAYS Performed at [facility], [address] [place], [place] [address]    REPTSTATUS PENDING 02/01/2020 2003    Other culture-see note  Medications: Scheduled Meds: . albuterol  2 puff Inhalation Q6H  . enoxaparin (LOVENOX) injection  40 mg Subcutaneous Q24H  . guaiFENesin  600 mg Oral BID  . methylPREDNISolone (SOLU-MEDROL) injection  80 mg Intravenous Q12H   Continuous Infusions: . remdesivir 100 mg in NS 100 mL Stopped (02/02/20 1127)    Antimicrobials: Anti-infectives (From admission, onward)   Start     Dose/Rate Route Frequency Ordered Stop   02/02/20 1000  remdesivir 100 mg in sodium chloride 0.9 % 100 mL IVPB       "Followed by" Linked Group Details   100 mg 200 mL/hr over 30 Minutes Intravenous Daily 02/01/20 1506 02/06/20 0959   02/01/20 1600  remdesivir 200 mg in sodium chloride 0.9% 250 mL IVPB       "Followed by" Linked Group Details   200 mg 580 mL/hr over 30 Minutes Intravenous Once 02/01/20 1506 02/01/20 2011     Objective: Vitals: Today's Vitals   02/02/20 2300 02/03/20 0000 02/03/20 0100 02/03/20 0535  BP: (!) 139/95 (!) 133/96 (!) 141/103 131/83  Pulse: 70 (!) 58 70 69  Resp: (!) 27 (!) 31 20 (!) 24  Temp:    98.7 F (37.1 C)  TempSrc:    Oral  SpO2: 97% 100% 99% 94%  Weight:      Height:      PainSc:        Intake/Output Summary (Last 24 hours) at 02/03/2020 0839 Last data filed at 02/02/2020 1456 Gross per 24 hour  Intake 200 ml  Output 725 ml  Net -525 ml   Filed Weights   02/01/20 1135  Weight: 81.6 kg   Weight change:   Intake/Output from previous day: 01/08 0701 - 01/09 0700 In: 200 [I.V.:200] Out: 725 [Urine:725] Intake/Output this shift: No intake/output data recorded.  Examination: General exam: AAOx3, sick looking NAD, weak appearing. HEENT:Oral mucosa moist, Ear/Nose WNL grossly, dentition normal. Respiratory system: bilaterally crackles on post lungs,no wheezing or crackles,no use of accessory  muscle Cardiovascular system: S1 & S2 +, No JVD,. Gastrointestinal system: Abdomen soft, NT,ND, BS+ Nervous System:Alert, awake, moving extremities and grossly nonfocal Extremities: No edema, distal peripheral pulses palpable.  Skin: No rashes,no icterus. MSK: Normal muscle bulk,tone, power  Data Reviewed: I have personally reviewed following labs and imaging studies CBC: Recent Labs  Lab 02/01/20 1147 02/02/20 0447 02/03/20 0714  WBC 7.0 2.8* 7.5  NEUTROABS 4.9 1.5* 5.4  HGB 14.1 14.8 13.9  HCT 43.4 46.2 43.4  MCV 92.1 93.0 93.3  PLT 224 247 270   Basic Metabolic Panel: Recent Labs  Lab 02/01/20 1147 02/02/20 0447 02/03/20 0714  NA 134* 138 138  K 4.3 4.7 4.9  CL 97* 103 104  CO2 26 24 25   GLUCOSE 135*  154* 158*  BUN 17 20 25*  CREATININE 1.38* 0.92 0.94  CALCIUM 8.5* 8.4* 8.2*  MG  --  2.7* 3.2*  PHOS  --  3.0 2.7   GFR: Estimated Creatinine Clearance: 94.3 mL/min (by C-G formula based on SCr of 0.94 mg/dL). Liver Function Tests: Recent Labs  Lab 02/01/20 1147 02/02/20 0447 02/03/20 0714  AST 63* 49* 45*  ALT 42 40 43  ALKPHOS 58 58 51  BILITOT 0.7 0.6 0.7  PROT 7.6 7.8 7.1  ALBUMIN 3.5 3.2* 3.0*   No results for input(s): LIPASE, AMYLASE in the last 168 hours. No results for input(s): AMMONIA in the last 168 hours. Coagulation Profile: No results for input(s): INR, PROTIME in the last 168 hours. Cardiac Enzymes: No results for input(s): CKTOTAL, CKMB, CKMBINDEX, TROPONINI in the last 168 hours. BNP (last 3 results) No results for input(s): PROBNP in the last 8760 hours. HbA1C: Recent Labs    02/02/20 0447  HGBA1C 6.3*   CBG: No results for input(s): GLUCAP in the last 168 hours. Lipid Profile: Recent Labs    02/01/20 2003  TRIG 110   Thyroid Function Tests: No results for input(s): TSH, T4TOTAL, FREET4, T3FREE, THYROIDAB in the last 72 hours. Anemia Panel: Recent Labs    02/01/20 2003 02/02/20 0447  FERRITIN 790* 874*   Sepsis  Labs: Recent Labs  Lab 02/01/20 2003  PROCALCITON 0.42  LATICACIDVEN 1.1    Recent Results (from the past 240 hour(s))  Blood Culture (routine x 2)     Status: None (Preliminary result)   Collection Time: 02/01/20  2:44 PM   Specimen: BLOOD  Result Value Ref Range Status   Specimen Description BLOOD BLOOD LEFT ARM  Final   Special Requests   Final    BOTTLES DRAWN AEROBIC AND ANAEROBIC Blood Culture results may not be optimal due to an inadequate volume of blood received in culture bottles   Culture   Final    NO GROWTH 2 DAYS Performed at [facility], [address], [place], [place] [address]    Report Status PENDING  Incomplete  Blood Culture (routine x 2)     Status: None (Preliminary result)   Collection Time: 02/01/20  8:03 PM   Specimen: BLOOD  Result Value Ref Range Status   Specimen Description BLOOD BLOOD RIGHT HAND  Final   Special Requests   Final    BOTTLES DRAWN AEROBIC AND ANAEROBIC Blood Culture adequate volume   Culture   Final    NO GROWTH 2 DAYS Performed at [facility], [address] [place], [place] [address]    Report Status PENDING  Incomplete     Radiology Studies: DG Chest 2 View  Result Date: 02/01/2020 CLINICAL DATA:  Short of breath.  COVID infection EXAM: CHEST - 2 VIEW COMPARISON:  None. FINDINGS: Normal cardiac silhouette. Bilateral patchy airspace disease with a lower lobe predominance. No pleural fluid. No pneumothorax. IMPRESSION: Moderate severity bilateral viral COVID pneumonia. Electronically Signed   By: Genevive Bi M.D.   On: 02/01/2020 12:28     LOS: 2 days   Lanae Boast, MD [facility]  02/03/2020, 8:39 AM

## 2020-02-03 NOTE — ED Notes (Addendum)
Pt lying in bed awake and alert; oriented x4.  NADN.  RR even and  Unlabored on 15L o2 via NRB.  O2 sats 100%.  Pt declines albuterol MDI; states his father used to use one and pt doesn't want it -- attempted to explain benefits for short term treatment for COVID however pt continues to refuse.  Pt also requests bp cuff be removed at this time.  While at bedside this nurse attempting to draw labs from existing saline lock however unsuccessful

## 2020-02-03 NOTE — ED Notes (Signed)
Pt weaned from NRB to nasal cannula @6L   - O2 sats 94%; will continue to monitor for acute changes.  Pt also instructed to call for assistance if any distress - pt acknowledges verbal understanding

## 2020-02-03 NOTE — Evaluation (Signed)
Physical Therapy Evaluation Patient Details Name: Jason Cowan MRN: [id number] DOB: 1970/07/04 Today's Date: 02/03/2020   History of Present Illness  50 yo male with onset of SOB and acute respiratory failure after dx of Covid 19 was brought to ED for medical management.  Pt has been fully independent prior to this, was home until SOB was unmanageable.  Has cough, body aches, hypoxia, Covid PNA, hyperglycemia, elevated creatinine.  PMHx:  elevated BMI, HTN,  Clinical Impression  Pt was seen for initial tolerance to move and desaturation of gait was his main issue.  Pt is at 7L at rest, then elevated to 8 on cannula for gait due to limits of setting on tank.  Pt did not like the increased feeling of air in nostrils, decreased to 6L O2.  Dropped to 75% in recovery and so went to 9L O2 on wall air after 20' walk.  Pt reported he was quite tired out from walking to BR before this.  Pt on 8L at rest to avoid further desaturation as he recovers.  Follow acutely as needed.  Recommend HHPT to follow up but may need to upgrade to SNF or CIR if he loses his strength on LE"s.  Evaluate for AD as needed.    Follow Up Recommendations Home health PT;Supervision for mobility/OOB    Equipment Recommendations  Rolling walker with 5" wheels    Recommendations for Other Services       Precautions / Restrictions Precautions Precautions: Fall Precaution Comments: monitor O2 sats Restrictions Weight Bearing Restrictions: No      Mobility  Bed Mobility Overal bed mobility: Modified Independent                  Transfers Overall transfer level: Needs assistance Equipment used: 1 person hand held assist Transfers: Sit to/from Stand Sit to Stand: Min guard         General transfer comment: min guard as pt is light headed  Ambulation/Gait Ambulation/Gait assistance: Min guard Gait Distance (Feet): 20 Feet Assistive device: 1 person hand held assist Gait Pattern/deviations:  Step-through pattern;Wide base of support;Decreased stride length   Gait velocity interpretation: <1.31 ft/sec, indicative of household ambulator General Gait Details: pt is walking with a controlled pace and note sats drop from 95% baseline on 7L to 75% desaturation with gait  Stairs            Wheelchair Mobility    Modified Rankin (Stroke Patients Only)       Balance Overall balance assessment: Needs assistance Sitting-balance support: Feet supported Sitting balance-Leahy Scale: Fair     Standing balance support: Single extremity supported Standing balance-Leahy Scale: Fair                               Pertinent Vitals/Pain Pain Assessment: No/denies pain    Home Living Family/patient expects to be discharged to:: Private residence Living Arrangements: Alone Available Help at Discharge: Family;Friend(s);Available PRN/intermittently Type of Home: House         Home Equipment: None      Prior Function Level of Independence: Independent         Comments: has been working full time     Hand Dominance   Dominant Hand: Right    Extremity/Trunk Assessment   Upper Extremity Assessment Upper Extremity Assessment: Overall WFL for tasks assessed    Lower Extremity Assessment Lower Extremity Assessment: Overall WFL for tasks assessed  Cervical / Trunk Assessment Cervical / Trunk Assessment: Other exceptions (chronic back and neck pain)  Communication   Communication: No difficulties  Cognition Arousal/Alertness: Awake/alert Behavior During Therapy: Anxious Overall Cognitive Status: Within Functional Limits for tasks assessed                                 General Comments: anxiety over struggle to catch his breath after walking      General Comments General comments (skin integrity, edema, etc.): pt is walked to door and back with sats declining immediately and after walk.  Note his struggle with SOB and left him on  8L O2 for resting post gait since sats were not recovering into 90's again    Exercises     Assessment/Plan    PT Assessment Patient needs continued PT services  PT Problem List Decreased activity tolerance;Decreased balance;Decreased knowledge of use of DME;Cardiopulmonary status limiting activity       PT Treatment Interventions DME instruction;Gait training;Stair training;Functional mobility training;Therapeutic exercise;Therapeutic activities;Neuromuscular re-education;Patient/family education;Balance training    PT Goals (Current goals can be found in the Care Plan section)  Acute Rehab PT Goals Patient Stated Goal: to get better, breathe better PT Goal Formulation: With patient Time For Goal Achievement: 02/17/20 Potential to Achieve Goals: Good    Frequency Min 2X/week   Barriers to discharge Inaccessible home environment;Decreased caregiver support home alone per chart    Co-evaluation               AM-PAC PT "6 Clicks" Mobility  Outcome Measure Help needed turning from your back to your side while in a flat bed without using bedrails?: A Little Help needed moving from lying on your back to sitting on the side of a flat bed without using bedrails?: A Little Help needed moving to and from a bed to a chair (including a wheelchair)?: A Little Help needed standing up from a chair using your arms (e.g., wheelchair or bedside chair)?: A Little Help needed to walk in hospital room?: A Little Help needed climbing 3-5 steps with a railing? : A Little 6 Click Score: 18    End of Session Equipment Utilized During Treatment: Oxygen Activity Tolerance: Patient limited by fatigue;Treatment limited secondary to medical complications (Comment) Patient left: in bed;with call bell/phone within reach Nurse Communication: Mobility status;Other (comment) (desaturation with gait on O2) PT Visit Diagnosis: Unsteadiness on feet (R26.81);Difficulty in walking, not elsewhere  classified (R26.2);Dizziness and giddiness (R42)    Time: 1259-1330 PT Time Calculation (min) (ACUTE ONLY): 31 min   Charges:   PT Evaluation $PT Eval Moderate Complexity: 1 Mod PT Treatments $Gait Training: 8-22 mins       Ivar Drape 02/03/2020, 3:27 PM  Samul Dada, PT MS Acute Rehab Dept. Number: [facility] [id number] and [facility] [phone]

## 2020-02-04 DIAGNOSIS — U071 COVID-19: Secondary | ICD-10-CM | POA: Diagnosis not present

## 2020-02-04 DIAGNOSIS — J9601 Acute respiratory failure with hypoxia: Secondary | ICD-10-CM | POA: Diagnosis not present

## 2020-02-04 LAB — CBC WITH DIFFERENTIAL/PLATELET
Abs Immature Granulocytes: 0.2 10*3/uL — ABNORMAL HIGH (ref 0.00–0.07)
Basophils Absolute: 0 10*3/uL (ref 0.0–0.1)
Basophils Relative: 0 %
Eosinophils Absolute: 0 10*3/uL (ref 0.0–0.5)
Eosinophils Relative: 0 %
HCT: 42.8 % (ref 39.0–52.0)
Hemoglobin: 14.1 g/dL (ref 13.0–17.0)
Immature Granulocytes: 2 %
Lymphocytes Relative: 14 %
Lymphs Abs: 1.5 10*3/uL (ref 0.7–4.0)
MCH: 30.3 pg (ref 26.0–34.0)
MCHC: 32.9 g/dL (ref 30.0–36.0)
MCV: 92 fL (ref 80.0–100.0)
Monocytes Absolute: 0.6 10*3/uL (ref 0.1–1.0)
Monocytes Relative: 5 %
Neutro Abs: 8.8 10*3/uL — ABNORMAL HIGH (ref 1.7–7.7)
Neutrophils Relative %: 79 %
Platelets: 296 10*3/uL (ref 150–400)
RBC: 4.65 MIL/uL (ref 4.22–5.81)
RDW: 14 % (ref 11.5–15.5)
WBC: 11.1 10*3/uL — ABNORMAL HIGH (ref 4.0–10.5)
nRBC: 0 % (ref 0.0–0.2)

## 2020-02-04 LAB — COMPREHENSIVE METABOLIC PANEL
ALT: 111 U/L — ABNORMAL HIGH (ref 0–44)
AST: 117 U/L — ABNORMAL HIGH (ref 15–41)
Albumin: 3.1 g/dL — ABNORMAL LOW (ref 3.5–5.0)
Alkaline Phosphatase: 60 U/L (ref 38–126)
Anion gap: 10 (ref 5–15)
BUN: 22 mg/dL — ABNORMAL HIGH (ref 6–20)
CO2: 25 mmol/L (ref 22–32)
Calcium: 8.3 mg/dL — ABNORMAL LOW (ref 8.9–10.3)
Chloride: 104 mmol/L (ref 98–111)
Creatinine, Ser: 0.96 mg/dL (ref 0.61–1.24)
GFR, Estimated: >60 mL/min (ref >60)
Glucose, Bld: 165 mg/dL — ABNORMAL HIGH (ref 70–99)
Potassium: 4.3 mmol/L (ref 3.5–5.1)
Sodium: 139 mmol/L (ref 135–145)
Total Bilirubin: 0.9 mg/dL (ref 0.3–1.2)
Total Protein: 7 g/dL (ref 6.5–8.1)

## 2020-02-04 LAB — C-REACTIVE PROTEIN: CRP: 1.3 mg/dL — ABNORMAL HIGH (ref <1.0)

## 2020-02-04 LAB — CBG MONITORING, ED
Glucose-Capillary: 123 mg/dL — ABNORMAL HIGH (ref 70–99)
Glucose-Capillary: 133 mg/dL — ABNORMAL HIGH (ref 70–99)

## 2020-02-04 LAB — PHOSPHORUS: Phosphorus: 2.6 mg/dL (ref 2.5–4.6)

## 2020-02-04 LAB — FERRITIN: Ferritin: 650 ng/mL — ABNORMAL HIGH (ref 24–336)

## 2020-02-04 LAB — GLUCOSE, CAPILLARY: Glucose-Capillary: 119 mg/dL — ABNORMAL HIGH (ref 70–99)

## 2020-02-04 LAB — MAGNESIUM: Magnesium: 2.9 mg/dL — ABNORMAL HIGH (ref 1.7–2.4)

## 2020-02-04 LAB — D-DIMER, QUANTITATIVE: D-Dimer, Quant: 3.85 ug/mL-FEU — ABNORMAL HIGH (ref 0.00–0.50)

## 2020-02-04 MED ORDER — THIAMINE HCL 100 MG PO TABS
100.0000 mg | ORAL_TABLET | Freq: Every day | ORAL | Status: DC
  Administered 2020-02-04 – 2020-02-12 (×9): 100 mg via ORAL
  Filled 2020-02-04 (×9): qty 1

## 2020-02-04 MED ORDER — FOLIC ACID 1 MG PO TABS
1.0000 mg | ORAL_TABLET | Freq: Every day | ORAL | Status: DC
  Administered 2020-02-04 – 2020-02-12 (×9): 1 mg via ORAL
  Filled 2020-02-04 (×10): qty 1

## 2020-02-04 MED ORDER — ZINC SULFATE 220 (50 ZN) MG PO CAPS
220.0000 mg | ORAL_CAPSULE | Freq: Every day | ORAL | Status: DC
  Administered 2020-02-04 – 2020-02-12 (×9): 220 mg via ORAL
  Filled 2020-02-04 (×9): qty 1

## 2020-02-04 MED ORDER — ASCORBIC ACID 500 MG PO TABS
500.0000 mg | ORAL_TABLET | Freq: Every day | ORAL | Status: DC
  Administered 2020-02-04 – 2020-02-12 (×8): 500 mg via ORAL
  Filled 2020-02-04 (×8): qty 1

## 2020-02-04 NOTE — ED Notes (Signed)
ED TO INPATIENT HANDOFF REPORT  ED Nurse Name and Phone #: Joseguadalupe Stan [phone]  S Name/Age/Gender Jason Cowan 50 y.o. male Room/Bed: ED33A/ED33A  Code Status   Code Status: Full Code  Home/SNF/Other Home Patient oriented to: self, place, time and situation Is this baseline? Yes   Triage Complete: Triage complete  Chief Complaint Pneumonia due to COVID-19 virus [U07.1, J12.82]  Triage Note Pt to ED via [facility] from home for shortness of breath. Pt states that he was diagnosed with COVID on Monday. Pt reports that he was having difficulty breathing yesterday but got worse last night. Pt called EMS this morning. Pt is currently on 2 liters of O2 via [place]. Pt states that he has had episodes of syncope due to his breathing. Pt is currently in NAD and able to speak in complete sentences at this time.     Allergies No Known Allergies  Level of Care/Admitting Diagnosis ED Disposition    ED Disposition Condition Comment   Admit  Hospital Area: [facility] [[id number]]  Level of Care: Med-Surg [16]  Covid Evaluation: Confirmed COVID Positive  Diagnosis: Pneumonia due to COVID-19 virus [[id number]]  Admitting Physician: Erenest Blank  Attending Physician: Kathrynn Running [[id number]]  Estimated length of stay: past midnight tomorrow  Certification:: I certify this patient will need inpatient services for at least 2 midnights       B Medical/Surgery History Past Medical History:  Diagnosis Date  . Hypertension    History reviewed. No pertinent surgical history.   A IV Location/Drains/Wounds Patient Lines/Drains/Airways Status    Active Line/Drains/Airways    Name Placement date Placement time Site Days   Peripheral IV 02/01/20 Left Wrist 02/01/20  2010  Wrist  3   Peripheral IV 02/03/20 Left Antecubital 02/03/20  1344  Antecubital  1          Intake/Output Last 24 hours  Intake/Output Summary (Last 24 hours) at 02/04/2020 1434 Last data  filed at 02/04/2020 0634 Gross per 24 hour  Intake 719.16 ml  Output 575 ml  Net 144.16 ml    Labs/Imaging Results for orders placed or performed during the hospital encounter of 02/01/20 (from the past 48 hour(s))  CBC with Differential/Platelet     Status: None   Collection Time: 02/03/20  7:14 AM  Result Value Ref Range   WBC 7.5 4.0 - 10.5 K/uL   RBC 4.65 4.22 - 5.81 MIL/uL   Hemoglobin 13.9 13.0 - 17.0 g/dL   HCT 70.9 62.8 - 36.6 %   MCV 93.3 80.0 - 100.0 fL   MCH 29.9 26.0 - 34.0 pg   MCHC 32.0 30.0 - 36.0 g/dL   RDW 29.4 76.5 - 46.5 %   Platelets 270 150 - 400 K/uL   nRBC 0.0 0.0 - 0.2 %   Neutrophils Relative % 72 %   Neutro Abs 5.4 1.7 - 7.7 K/uL   Lymphocytes Relative 20 %   Lymphs Abs 1.5 0.7 - 4.0 K/uL   Monocytes Relative 7 %   Monocytes Absolute 0.5 0.1 - 1.0 K/uL   Eosinophils Relative 0 %   Eosinophils Absolute 0.0 0.0 - 0.5 K/uL   Basophils Relative 0 %   Basophils Absolute 0.0 0.0 - 0.1 K/uL   Immature Granulocytes 1 %   Abs Immature Granulocytes 0.05 0.00 - 0.07 K/uL    Comment: Performed at [facility], [address] [place], [place] [address]  Comprehensive metabolic panel     Status:  Abnormal   Collection Time: 02/03/20  7:14 AM  Result Value Ref Range   Sodium 138 135 - 145 mmol/L   Potassium 4.9 3.5 - 5.1 mmol/L   Chloride 104 98 - 111 mmol/L   CO2 25 22 - 32 mmol/L   Glucose, Bld 158 (H) 70 - 99 mg/dL    Comment: Glucose reference range applies only to samples taken after fasting for at least 8 hours.   BUN 25 (H) 6 - 20 mg/dL   Creatinine, Ser 1.610.94 0.61 - 1.24 mg/dL   Calcium 8.2 (L) 8.9 - 10.3 mg/dL   Total Protein 7.1 6.5 - 8.1 g/dL   Albumin 3.0 (L) 3.5 - 5.0 g/dL   AST 45 (H) 15 - 41 U/L   ALT 43 0 - 44 U/L   Alkaline Phosphatase 51 38 - 126 U/L   Total Bilirubin 0.7 0.3 - 1.2 mg/dL   GFR, Estimated >09>60 >60>60 mL/min    Comment: (NOTE) Calculated using the CKD-EPI Creatinine Equation (2021)    Anion gap 9 5 - 15     Comment: Performed at [facility], [address] [place], [place] [address]  C-reactive protein     Status: Abnormal   Collection Time: 02/03/20  7:14 AM  Result Value Ref Range   CRP 3.2 (H) <1.0 mg/dL    Comment: Performed at [facility], [address] [place], [place] [address]  Ferritin     Status: Abnormal   Collection Time: 02/03/20  7:14 AM  Result Value Ref Range   Ferritin 741 (H) 24 - 336 ng/mL    Comment: Performed at [facility], [address] [place], [place] [address]  Magnesium     Status: Abnormal   Collection Time: 02/03/20  7:14 AM  Result Value Ref Range   Magnesium 3.2 (H) 1.7 - 2.4 mg/dL    Comment: Performed at [facility], [address] [place], [place] [address]  Phosphorus     Status: None   Collection Time: 02/03/20  7:14 AM  Result Value Ref Range   Phosphorus 2.7 2.5 - 4.6 mg/dL    Comment: Performed at [facility], [address] [place], [place] [address]  D-dimer, quantitative (not at [facility])     Status: Abnormal   Collection Time: 02/03/20  7:14 AM  Result Value Ref Range   D-Dimer, Quant 1.08 (H) 0.00 - 0.50 ug/mL-FEU    Comment: (NOTE) At the manufacturer cut-off value of 0.5 g/mL FEU, this assay has a negative predictive value of 95-100%.This assay is intended for use in conjunction with a clinical pretest probability (PTP) assessment model to exclude pulmonary embolism (PE) and deep venous thrombosis (DVT) in outpatients suspected of PE or DVT. Results should be correlated with clinical presentation. Performed at [facility], [address] [place], [place] [address]   CBG monitoring, ED     Status: Abnormal   Collection Time: 02/03/20  9:54 PM  Result Value Ref Range   Glucose-Capillary 134 (H) 70 - 99 mg/dL    Comment: Glucose reference range applies only to samples taken after fasting for at least 8 hours.  CBC with Differential/Platelet     Status: Abnormal   Collection  Time: 02/04/20  5:07 AM  Result Value Ref Range   WBC 11.1 (H) 4.0 - 10.5 K/uL   RBC 4.65 4.22 - 5.81 MIL/uL   Hemoglobin 14.1 13.0 - 17.0 g/dL   HCT 29.542.8 28.439.0 -  52.0 %   MCV 92.0 80.0 - 100.0 fL   MCH 30.3 26.0 - 34.0 pg   MCHC 32.9 30.0 - 36.0 g/dL   RDW 16.114.0 09.611.5 - 04.515.5 %   Platelets 296 150 - 400 K/uL   nRBC 0.0 0.0 - 0.2 %   Neutrophils Relative % 79 %   Neutro Abs 8.8 (H) 1.7 - 7.7 K/uL   Lymphocytes Relative 14 %   Lymphs Abs 1.5 0.7 - 4.0 K/uL   Monocytes Relative 5 %   Monocytes Absolute 0.6 0.1 - 1.0 K/uL   Eosinophils Relative 0 %   Eosinophils Absolute 0.0 0.0 - 0.5 K/uL   Basophils Relative 0 %   Basophils Absolute 0.0 0.0 - 0.1 K/uL   Immature Granulocytes 2 %   Abs Immature Granulocytes 0.20 (H) 0.00 - 0.07 K/uL    Comment: Performed at [facility], [address] [place], [place] [address]  Comprehensive metabolic panel     Status: Abnormal   Collection Time: 02/04/20  5:07 AM  Result Value Ref Range   Sodium 139 135 - 145 mmol/L   Potassium 4.3 3.5 - 5.1 mmol/L   Chloride 104 98 - 111 mmol/L   CO2 25 22 - 32 mmol/L   Glucose, Bld 165 (H) 70 - 99 mg/dL    Comment: Glucose reference range applies only to samples taken after fasting for at least 8 hours.   BUN 22 (H) 6 - 20 mg/dL   Creatinine, Ser 1.910.96 0.61 - 1.24 mg/dL   Calcium 8.3 (L) 8.9 - 10.3 mg/dL   Total Protein 7.0 6.5 - 8.1 g/dL   Albumin 3.1 (L) 3.5 - 5.0 g/dL   AST 478117 (H) 15 - 41 U/L   ALT 111 (H) 0 - 44 U/L   Alkaline Phosphatase 60 38 - 126 U/L   Total Bilirubin 0.9 0.3 - 1.2 mg/dL   GFR, Estimated >29>60 >56>60 mL/min    Comment: (NOTE) Calculated using the CKD-EPI Creatinine Equation (2021)    Anion gap 10 5 - 15    Comment: Performed at [facility], [address] [place], [place] [address]  C-reactive protein     Status: Abnormal   Collection Time: 02/04/20  5:07 AM  Result Value Ref Range   CRP 1.3 (H) <1.0 mg/dL    Comment: Performed at [facility],  [address] [place], [place] [address]  Ferritin     Status: Abnormal   Collection Time: 02/04/20  5:07 AM  Result Value Ref Range   Ferritin 650 (H) 24 - 336 ng/mL    Comment: Performed at [facility], [address] [place], [place] [address]  Magnesium     Status: Abnormal   Collection Time: 02/04/20  5:07 AM  Result Value Ref Range   Magnesium 2.9 (H) 1.7 - 2.4 mg/dL    Comment: Performed at [facility], [address] [place], [place] [address]  Phosphorus     Status: None   Collection Time: 02/04/20  5:07 AM  Result Value Ref Range   Phosphorus 2.6 2.5 - 4.6 mg/dL    Comment: Performed at [facility], [address] [place], [place] [address]  D-dimer, quantitative (not at [facility])     Status: Abnormal   Collection Time: 02/04/20  5:07 AM  Result Value Ref Range   D-Dimer, Quant 3.85 (H) 0.00 - 0.50 ug/mL-FEU    Comment: (NOTE) At the manufacturer cut-off value of 0.5  g/mL FEU, this assay has a negative predictive value of 95-100%.This assay is intended for use in conjunction with a clinical pretest probability (PTP) assessment model to exclude pulmonary embolism (PE) and deep venous thrombosis (DVT) in outpatients suspected of PE or DVT. Results should be correlated with clinical presentation. Performed at [facility], [address] [place], [place] [address]   CBG monitoring, ED     Status: Abnormal   Collection Time: 02/04/20 11:13 AM  Result Value Ref Range   Glucose-Capillary 123 (H) 70 - 99 mg/dL    Comment: Glucose reference range applies only to samples taken after fasting for at least 8 hours.   CT ANGIO CHEST PE W OR WO CONTRAST  Result Date: 02/03/2020 CLINICAL DATA:  Shortness of breath.  COVID positive. EXAM: CT ANGIOGRAPHY CHEST WITH CONTRAST TECHNIQUE: Multidetector CT imaging of the chest was performed using the standard protocol during bolus administration of intravenous contrast. Multiplanar CT image  reconstructions and MIPs were obtained to evaluate the vascular anatomy. CONTRAST:  OMNIPAQUE IOHEXOL 350 MG/ML SOLN COMPARISON:  None. FINDINGS: Cardiovascular: Evaluation is limited secondary to respiratory motion, particularly in the bases. No pulmonary embolism through the segmental pulmonary arteries. Heart is at the upper limits of normal in size. LEFT-sided coronary artery atherosclerotic calcifications. Mediastinum/Nodes: Thyroid is unremarkable. No axillary adenopathy. No suspicious mediastinal adenopathy. Lungs/Pleura: No pleural effusion or pneumothorax. There are diffuse bilateral peripheral predominant ground-glass opacities which are nearly completely confluent within the bilateral lower lobes. Upper Abdomen: No acute abnormality. Musculoskeletal: No acute osseous abnormality. Review of the MIP images confirms the above findings. IMPRESSION: 1. No evidence of acute pulmonary embolism through the segmental pulmonary arteries. 2. Diffuse bilateral peripheral predominant ground-glass opacities which are nearly completely confluent within the bilateral lower lobes. Findings are consistent with COVID-19 pneumonia. 3. LEFT-sided coronary artery atherosclerotic calcifications. Aortic Atherosclerosis (ICD10-I70.0). Electronically Signed   By: Meda Klinefelter MD   On: 02/03/2020 16:48    Pending Labs Unresulted Labs (From admission, onward)          Start     Ordered   02/02/20 0500  CBC with Differential/Platelet  Daily,   STAT      02/01/20 1506   02/02/20 0500  Comprehensive metabolic panel  Daily,   STAT      02/01/20 1506   02/02/20 0500  C-reactive protein  Daily,   STAT      02/01/20 1506   02/02/20 0500  Fibrin derivatives D-Dimer ([facility] only)  Daily,   STAT      02/01/20 1506   02/02/20 0500  Ferritin  Daily,   STAT      02/01/20 1506   02/02/20 0500  Magnesium  Daily,   STAT      02/01/20 1506   02/02/20 0500  Phosphorus  Daily,   STAT      02/01/20 1506           Vitals/Pain Today's Vitals   02/04/20 1330 02/04/20 1400 02/04/20 1432 02/04/20 1432  BP:    (!) 149/91  Pulse: 72 74 64 64  Resp: (!) 38 (!) 27 (!) 23 (!) 24  Temp:      TempSrc:      SpO2: 100% 95% 93% 97%  Weight:      Height:      PainSc:    0-No pain    Isolation Precautions Airborne and Contact precautions  Medications Medications  enoxaparin (LOVENOX) injection 40 mg (40 mg Subcutaneous  Given 02/03/20 2133)  acetaminophen (TYLENOL) tablet 650 mg (has no administration in time range)  0.9 %  sodium chloride infusion ( Intravenous Not Given 02/03/20 2147)  guaiFENesin (MUCINEX) 12 hr tablet 600 mg (600 mg Oral Given 02/04/20 1104)  guaiFENesin-dextromethorphan (ROBITUSSIN DM) 100-10 MG/5ML syrup 15 mL (has no administration in time range)  albuterol (VENTOLIN HFA) 108 (90 Base) MCG/ACT inhaler 2 puff (2 puffs Inhalation Not Given 02/04/20 1431)  methylPREDNISolone sodium succinate (SOLU-MEDROL) 125 mg/2 mL injection 80 mg (80 mg Intravenous Given 02/04/20 0659)  insulin aspart (novoLOG) injection 0-9 Units (0 Units Subcutaneous Not Given 02/04/20 1316)  ascorbic acid (VITAMIN C) tablet 500 mg (500 mg Oral Given 02/04/20 1250)  zinc sulfate capsule 220 mg (220 mg Oral Given 02/04/20 1251)  thiamine tablet 100 mg (100 mg Oral Given 02/04/20 1251)  folic acid (FOLVITE) tablet 1 mg (1 mg Oral Given 02/04/20 1250)  dexamethasone (DECADRON) injection 8 mg (8 mg Intravenous Given 02/01/20 1719)  remdesivir 200 mg in sodium chloride 0.9% 250 mL IVPB (0 mg Intravenous Stopped 02/01/20 2011)    Followed by  remdesivir 100 mg in sodium chloride 0.9 % 100 mL IVPB (0 mg Intravenous Stopped 02/04/20 1208)  tocilizumab (ACTEMRA) 8 mg/kg = 652 mg in sodium chloride 0.9 % 100 mL infusion (0 mg Intravenous Stopped 02/02/20 2325)  iohexol (OMNIPAQUE) 350 MG/ML injection 100 mL (100 mLs Intravenous Contrast Given 02/03/20 1621)    Mobility walks Low fall risk   Focused Assessments Cardiac Assessment  Handoff:    No results found for: CKTOTAL, CKMB, CKMBINDEX, TROPONINI Lab Results  Component Value Date   DDIMER 3.85 (H) 02/04/2020   Does the Patient currently have chest pain? No  , Pulmonary Assessment Handoff:  Lung sounds: Bilateral Breath Sounds: Diminished L Breath Sounds: Diminished R Breath Sounds: Diminished O2 Device: Nasal Cannula O2 Flow Rate (L/min): 30 L/min      R Recommendations: See Admitting Provider Note  Report given to:   Additional Notes:

## 2020-02-04 NOTE — ED Notes (Signed)
Pt repositioning self in bed.

## 2020-02-04 NOTE — ED Notes (Signed)
Receiving RN messaged for pt handoff

## 2020-02-04 NOTE — ED Notes (Signed)
Pt alert, on cell phone.  High flow oxygen in place.  Iv in place.

## 2020-02-04 NOTE — Progress Notes (Signed)
PROGRESS NOTE    Jason Cowan  PJA:[id number] DOB: 1970-04-06 DOA: 02/01/2020 PCP: Patient, No Pcp Per   Chief Complaint  Patient presents with  . Shortness of Breath  . Covid Positive   Brief Narrative: 50 year old male with history of hypertension, unvaccinated for COVID presented with multiple complaints of body aches cough  onset 1/1. Seen in the ED was hypoxic 87% on room air improved to normal with 2 L, COVID-19 positive and chest x-ray showed moderate severity bilateral viral pneumonia and patient was admitted.   Patient was on 3 to nasal cannula in the morning and oxygen demand significantly increased by evening to NRB on 1/8 and patient was given Actemra 1/9- o2 was down to 6l 1/10-patient placed on high flow nasal cannula 30 L overnight.  Subjective: Overnight more hypoxic on HFNC at CRP down 3.2 Patient reports his breathing is "about the same" He reports he is mostly dyspneic with minimal activity.   Assessment & Plan:  Acute hypoxemic respiratory failure due to COVID-19/COVID-19 pneumonia: Patient with worsening respiratory failure, with ongoing increasing need for O2.  Now on 30 L high flow nasal cannula.  He is not in acute distress resting comfortably on the bed.  Mostly symptomatic with minimal activity.   S/p Actemra 1/18 night. Patient is considered to be high risk given his rapidly worsening respiratory failure and new onset prediabetes. CTA no PE 1/19.  We will continue IV Solu-Medrol, remdesivir, inhaler, add zinc/vita c, vitamins, cont antitussives and respiratory support. Monitor inflammatory markers as below with CRP improving. COVID-19 Labs Recent Labs    02/01/20 2003 02/02/20 0447 02/03/20 0714 02/04/20 0507  DDIMER 1.63* 1.42* 1.08*  --   FERRITIN 790* 874* 741* 650*  LDH 438*  --   --   --   CRP 9.5* 9.6* 3.2*  --    Acute renal failure/Hyponatremia: Renal function improved.  He is tolerating p.o. we will discontinue IV fluids.    Recent Labs  Lab 02/01/20 1147 02/02/20 0447 02/03/20 0714 02/04/20 0507  BUN 17 20 25* 22*  CREATININE 1.38* 0.92 0.94 0.96   Leukopenia: WBC now uptrending, patient is on a steroid.    HTN: BP is controlled.  Not on medication.    New onset prediabetes hemoglobin A1c found to be 6.3.  Patient informed about the diagnosis.  Monitor CBG cont ssi Recent Labs  Lab 02/03/20 2154  GLUCAP 134*   Mild transaminitis from covid, monitor  Overweight w/ BMI 29  The treatment plan and use of medications and known side effects were discussed with patient, they were clearly explained that there is no proven definitive treatment for COVID-19 infection, any medications used here are based on published clinical articles/anecdotal data which are not peer-reviewed or randomized control trials.  Complete risks and long-term side effects are unknown, however in the best clinical judgment they seem to be of some clinical benefit rather than medical risks.  Patient agree with the treatment plan and want to receive the given medications.   Nutrition: Diet Order            Diet regular Room service appropriate? Yes; Fluid consistency: Thin  Diet effective now                 Body mass index is 29.05 kg/m. DVT prophylaxis: enoxaparin (LOVENOX) injection 40 mg Start: 02/01/20 2200 Code Status:   Code Status: Full Code  Family Communication: plan of care discussed with patient at bedside.  Status  is: Inpatient Remains inpatient appropriate because:IV treatments appropriate due to intensity of illness or inability to take PO and Inpatient level of care appropriate due to severity of illness  Dispo: The patient is from: Home              Anticipated d/c is to: Home              Anticipated d/c date is: 3 days              Patient currently is not medically stable to d/c.  Consultants:see note  Procedures:see note  Culture/Microbiology    Component Value Date/Time   SDES BLOOD BLOOD RIGHT  HAND 02/01/2020 2003   SPECREQUEST  02/01/2020 2003    BOTTLES DRAWN AEROBIC AND ANAEROBIC Blood Culture adequate volume   CULT  02/01/2020 2003    NO GROWTH 2 DAYS Performed at [facility], [address] [place], [place] [address]    REPTSTATUS PENDING 02/01/2020 2003    Other culture-see note  Medications: Scheduled Meds: . albuterol  2 puff Inhalation Q6H  . enoxaparin (LOVENOX) injection  40 mg Subcutaneous Q24H  . guaiFENesin  600 mg Oral BID  . insulin aspart  0-9 Units Subcutaneous TID WC  . methylPREDNISolone (SOLU-MEDROL) injection  80 mg Intravenous Q12H   Continuous Infusions: . sodium chloride 50 mL/hr at 02/04/20 0634  . remdesivir 100 mg in NS 100 mL Stopped (02/03/20 1052)    Antimicrobials: Anti-infectives (From admission, onward)   Start     Dose/Rate Route Frequency Ordered Stop   02/02/20 1000  remdesivir 100 mg in sodium chloride 0.9 % 100 mL IVPB       "Followed by" Linked Group Details   100 mg 200 mL/hr over 30 Minutes Intravenous Daily 02/01/20 1506 02/06/20 0959   02/01/20 1600  remdesivir 200 mg in sodium chloride 0.9% 250 mL IVPB       "Followed by" Linked Group Details   200 mg 580 mL/hr over 30 Minutes Intravenous Once 02/01/20 1506 02/01/20 2011     Objective: Vitals: Today's Vitals   02/04/20 0615 02/04/20 0630 02/04/20 0635 02/04/20 0642  BP:    (!) 141/85  Pulse: 87 85 72 62  Resp: (!) 36 (!) 28 (!) 28 (!) 30  Temp:      TempSrc:      SpO2: (!) 88% (!) 89% 94% 97%  Weight:      Height:      PainSc:        Intake/Output Summary (Last 24 hours) at 02/04/2020 0755 Last data filed at 02/04/2020 0634 Gross per 24 hour  Intake 719.16 ml  Output 575 ml  Net 144.16 ml   Filed Weights   02/01/20 1135  Weight: 81.6 kg   Weight change:   Intake/Output from previous day: 01/09 0701 - 01/10 0700 In: 719.2 [I.V.:619.2; IV Piggyback:100] Out: 575 [Urine:575] Intake/Output this shift: No intake/output data  recorded.  Examination: General exam: AAOx3, on high flow nasal cannula but not in acute distress.  Able to speak in full sentence HEENT:Oral mucosa moist, Ear/Nose WNL grossly, dentition normal. Respiratory system: bilaterally diminished, no use of accessory muscle Cardiovascular system: S1 & S2 +, No JVD,. Gastrointestinal system: Abdomen soft, NT,ND, BS+ Nervous System:Alert, awake, moving extremities and grossly nonfocal Extremities: No edema, distal peripheral pulses palpable.  Skin: No rashes,no icterus. MSK: Normal muscle bulk,tone, power  Data Reviewed: I have personally reviewed following labs and imaging studies CBC: Recent Labs  Lab  02/01/20 1147 02/02/20 0447 02/03/20 0714 02/04/20 0507  WBC 7.0 2.8* 7.5 11.1*  NEUTROABS 4.9 1.5* 5.4 8.8*  HGB 14.1 14.8 13.9 14.1  HCT 43.4 46.2 43.4 42.8  MCV 92.1 93.0 93.3 92.0  PLT 224 247 270 296   Basic Metabolic Panel: Recent Labs  Lab 02/01/20 1147 02/02/20 0447 02/03/20 0714 02/04/20 0507  NA 134* 138 138 139  K 4.3 4.7 4.9 4.3  CL 97* 103 104 104  CO2 26 24 25 25   GLUCOSE 135* 154* 158* 165*  BUN 17 20 25* 22*  CREATININE 1.38* 0.92 0.94 0.96  CALCIUM 8.5* 8.4* 8.2* 8.3*  MG  --  2.7* 3.2* 2.9*  PHOS  --  3.0 2.7 2.6   GFR: Estimated Creatinine Clearance: 92.3 mL/min (by C-G formula based on SCr of 0.96 mg/dL). Liver Function Tests: Recent Labs  Lab 02/01/20 1147 02/02/20 0447 02/03/20 0714 02/04/20 0507  AST 63* 49* 45* 117*  ALT 42 40 43 111*  ALKPHOS 58 58 51 60  BILITOT 0.7 0.6 0.7 0.9  PROT 7.6 7.8 7.1 7.0  ALBUMIN 3.5 3.2* 3.0* 3.1*   No results for input(s): LIPASE, AMYLASE in the last 168 hours. No results for input(s): AMMONIA in the last 168 hours. Coagulation Profile: No results for input(s): INR, PROTIME in the last 168 hours. Cardiac Enzymes: No results for input(s): CKTOTAL, CKMB, CKMBINDEX, TROPONINI in the last 168 hours. BNP (last 3 results) No results for input(s): PROBNP in  the last 8760 hours. HbA1C: Recent Labs    02/02/20 0447  HGBA1C 6.3*   CBG: Recent Labs  Lab 02/03/20 2154  GLUCAP 134*   Lipid Profile: Recent Labs    02/01/20 2003  TRIG 110   Thyroid Function Tests: No results for input(s): TSH, T4TOTAL, FREET4, T3FREE, THYROIDAB in the last 72 hours. Anemia Panel: Recent Labs    02/03/20 0714 02/04/20 0507  FERRITIN 741* 650*   Sepsis Labs: Recent Labs  Lab 02/01/20 2003  PROCALCITON 0.42  LATICACIDVEN 1.1    Recent Results (from the past 240 hour(s))  Blood Culture (routine x 2)     Status: None (Preliminary result)   Collection Time: 02/01/20  2:44 PM   Specimen: BLOOD  Result Value Ref Range Status   Specimen Description BLOOD BLOOD LEFT ARM  Final   Special Requests   Final    BOTTLES DRAWN AEROBIC AND ANAEROBIC Blood Culture results may not be optimal due to an inadequate volume of blood received in culture bottles   Culture   Final    NO GROWTH 2 DAYS Performed at [facility], [address] [place], [place] [address]    Report Status PENDING  Incomplete  Blood Culture (routine x 2)     Status: None (Preliminary result)   Collection Time: 02/01/20  8:03 PM   Specimen: BLOOD  Result Value Ref Range Status   Specimen Description BLOOD BLOOD RIGHT HAND  Final   Special Requests   Final    BOTTLES DRAWN AEROBIC AND ANAEROBIC Blood Culture adequate volume   Culture   Final    NO GROWTH 2 DAYS Performed at [facility], [address] [place], [place] [address]    Report Status PENDING  Incomplete     Radiology Studies: CT ANGIO CHEST PE W OR WO CONTRAST  Result Date: 02/03/2020 CLINICAL DATA:  Shortness of breath.  COVID positive. EXAM: CT ANGIOGRAPHY CHEST WITH CONTRAST TECHNIQUE: Multidetector CT imaging of the chest was performed  using the standard protocol during bolus administration of intravenous contrast. Multiplanar CT image reconstructions and MIPs were obtained to evaluate the  vascular anatomy. CONTRAST:  OMNIPAQUE IOHEXOL 350 MG/ML SOLN COMPARISON:  None. FINDINGS: Cardiovascular: Evaluation is limited secondary to respiratory motion, particularly in the bases. No pulmonary embolism through the segmental pulmonary arteries. Heart is at the upper limits of normal in size. LEFT-sided coronary artery atherosclerotic calcifications. Mediastinum/Nodes: Thyroid is unremarkable. No axillary adenopathy. No suspicious mediastinal adenopathy. Lungs/Pleura: No pleural effusion or pneumothorax. There are diffuse bilateral peripheral predominant ground-glass opacities which are nearly completely confluent within the bilateral lower lobes. Upper Abdomen: No acute abnormality. Musculoskeletal: No acute osseous abnormality. Review of the MIP images confirms the above findings. IMPRESSION: 1. No evidence of acute pulmonary embolism through the segmental pulmonary arteries. 2. Diffuse bilateral peripheral predominant ground-glass opacities which are nearly completely confluent within the bilateral lower lobes. Findings are consistent with COVID-19 pneumonia. 3. LEFT-sided coronary artery atherosclerotic calcifications. Aortic Atherosclerosis (ICD10-I70.0). Electronically Signed   By: Meda Klinefelter MD   On: 02/03/2020 16:48     LOS: 3 days   Lanae Boast, MD [facility]  02/04/2020, 7:55 AM

## 2020-02-05 ENCOUNTER — Other Ambulatory Visit: Payer: Self-pay

## 2020-02-05 DIAGNOSIS — J1282 Pneumonia due to coronavirus disease 2019: Secondary | ICD-10-CM | POA: Diagnosis not present

## 2020-02-05 DIAGNOSIS — J9601 Acute respiratory failure with hypoxia: Secondary | ICD-10-CM | POA: Diagnosis not present

## 2020-02-05 DIAGNOSIS — R7401 Elevation of levels of liver transaminase levels: Secondary | ICD-10-CM | POA: Diagnosis not present

## 2020-02-05 DIAGNOSIS — U071 COVID-19: Secondary | ICD-10-CM | POA: Diagnosis not present

## 2020-02-05 LAB — CBC WITH DIFFERENTIAL/PLATELET
Abs Immature Granulocytes: 0.25 10*3/uL — ABNORMAL HIGH (ref 0.00–0.07)
Basophils Absolute: 0 10*3/uL (ref 0.0–0.1)
Basophils Relative: 0 %
Eosinophils Absolute: 0 10*3/uL (ref 0.0–0.5)
Eosinophils Relative: 0 %
HCT: 44.3 % (ref 39.0–52.0)
Hemoglobin: 14.7 g/dL (ref 13.0–17.0)
Immature Granulocytes: 3 %
Lymphocytes Relative: 9 %
Lymphs Abs: 0.9 10*3/uL (ref 0.7–4.0)
MCH: 30.3 pg (ref 26.0–34.0)
MCHC: 33.2 g/dL (ref 30.0–36.0)
MCV: 91.3 fL (ref 80.0–100.0)
Monocytes Absolute: 0.6 10*3/uL (ref 0.1–1.0)
Monocytes Relative: 6 %
Neutro Abs: 8.4 10*3/uL — ABNORMAL HIGH (ref 1.7–7.7)
Neutrophils Relative %: 82 %
Platelets: 291 10*3/uL (ref 150–400)
RBC: 4.85 MIL/uL (ref 4.22–5.81)
RDW: 13.9 % (ref 11.5–15.5)
WBC: 10.1 10*3/uL (ref 4.0–10.5)
nRBC: 0.2 % (ref 0.0–0.2)

## 2020-02-05 LAB — COMPREHENSIVE METABOLIC PANEL
ALT: 84 U/L — ABNORMAL HIGH (ref 0–44)
AST: 35 U/L (ref 15–41)
Albumin: 3 g/dL — ABNORMAL LOW (ref 3.5–5.0)
Alkaline Phosphatase: 64 U/L (ref 38–126)
Anion gap: 11 (ref 5–15)
BUN: 25 mg/dL — ABNORMAL HIGH (ref 6–20)
CO2: 25 mmol/L (ref 22–32)
Calcium: 8.7 mg/dL — ABNORMAL LOW (ref 8.9–10.3)
Chloride: 101 mmol/L (ref 98–111)
Creatinine, Ser: 1.01 mg/dL (ref 0.61–1.24)
GFR, Estimated: >60 mL/min (ref >60)
Glucose, Bld: 150 mg/dL — ABNORMAL HIGH (ref 70–99)
Potassium: 4.8 mmol/L (ref 3.5–5.1)
Sodium: 137 mmol/L (ref 135–145)
Total Bilirubin: 0.9 mg/dL (ref 0.3–1.2)
Total Protein: 6.7 g/dL (ref 6.5–8.1)

## 2020-02-05 LAB — PHOSPHORUS: Phosphorus: 3.4 mg/dL (ref 2.5–4.6)

## 2020-02-05 LAB — GLUCOSE, CAPILLARY
Glucose-Capillary: 147 mg/dL — ABNORMAL HIGH (ref 70–99)
Glucose-Capillary: 124 mg/dL — ABNORMAL HIGH (ref 70–99)
Glucose-Capillary: 158 mg/dL — ABNORMAL HIGH (ref 70–99)

## 2020-02-05 LAB — C-REACTIVE PROTEIN: CRP: 0.8 mg/dL (ref <1.0)

## 2020-02-05 LAB — MAGNESIUM: Magnesium: 2.8 mg/dL — ABNORMAL HIGH (ref 1.7–2.4)

## 2020-02-05 LAB — FERRITIN: Ferritin: 488 ng/mL — ABNORMAL HIGH (ref 24–336)

## 2020-02-05 LAB — D-DIMER, QUANTITATIVE: D-Dimer, Quant: >20 ug/mL-FEU — ABNORMAL HIGH (ref 0.00–0.50)

## 2020-02-05 MED ORDER — ORAL CARE MOUTH RINSE
15.0000 mL | Freq: Two times a day (BID) | OROMUCOSAL | Status: DC
  Administered 2020-02-06 – 2020-02-12 (×7): 15 mL via OROMUCOSAL

## 2020-02-05 MED ORDER — ALPRAZOLAM 0.25 MG PO TABS
0.2500 mg | ORAL_TABLET | Freq: Once | ORAL | Status: AC
  Administered 2020-02-05: 0.25 mg via ORAL
  Filled 2020-02-05: qty 1

## 2020-02-05 NOTE — Plan of Care (Signed)

## 2020-02-05 NOTE — Progress Notes (Signed)
PROGRESS NOTE    Jason Cowan  IBB:[id number] DOB: March 16, 1970 DOA: 02/01/2020 PCP: Patient, No Pcp Per   Assessment & Plan:   Principal Problem:   Acute hypoxemic respiratory failure due to COVID-19 [facility]) Active Problems:   Pneumonia due to COVID-19 virus   HTN (hypertension)  Acute hypoxemic respiratory failure: secondary to  COVID-19 pneumonia. Continue on IV remdesivir, IV steroids, zinc, vitamin c. S/p actemra 02/02/20. Continue on supplemental oxygen and wean as tolerated. Continue on bronchodilators and encourage incentive spirometry   COVID19 pneumonia: unvaccinated. Management as stated above  AKI: resolved  Leukopenia: resolved   Pre-DM: new onset w/ HbA1C 6.3. Continue on SSI w/ accuchecks   Transaminitis: secondary to COVID19.   DVT prophylaxis: lovenox Code Status: full  Family Communication:  Disposition Plan:  Likely home w/ home health  Status is: Inpatient  Remains inpatient appropriate because:IV treatments appropriate due to intensity of illness or inability to take PO and Inpatient level of care appropriate due to severity of illness   Dispo: The patient is from: Home              Anticipated d/c is to: Home              Anticipated d/c date is: 3 days              Patient currently is not medically stable to d/c.     Consultants:      Procedures:    Antimicrobials:   Subjective: Pt c/o shortness of breath  Objective: Vitals:   02/04/20 2050 02/04/20 2306 02/04/20 2310 02/05/20 0409  BP: (!) 159/97 (!) 153/102  (!) 136/95  Pulse:  78  72  Resp:  18    Temp:  98.4 F (36.9 C)  98.2 F (36.8 C)  TempSrc:  Oral  Oral  SpO2:  90%  90%  Weight:   82.3 kg 85.2 kg  Height:   5\' 6"  (1.676 m)     Intake/Output Summary (Last 24 hours) at 02/05/2020 0828 Last data filed at 02/05/2020 0408 Gross per 24 hour  Intake 240 ml  Output 1550 ml  Net -1310 ml   Filed Weights   02/01/20 1135 02/04/20 2310 02/05/20 0409  Weight: 81.6  kg 82.3 kg 85.2 kg    Examination:  General exam: Appears calm but uncomfortable  Respiratory system: decreased breath sounds b/l  Cardiovascular system: S1 & S2 +. No rubs, gallops or clicks Gastrointestinal system: Abdomen is nondistended, soft and nontender. Normal bowel sounds heard. Central nervous system: Alert and oriented. Moves all 4 extremities  Psychiatry: Judgement and insight appear normal. Flat mood and affect      Data Reviewed: I have personally reviewed following labs and imaging studies  CBC: Recent Labs  Lab 02/01/20 1147 02/02/20 0447 02/03/20 0714 02/04/20 0507  WBC 7.0 2.8* 7.5 11.1*  NEUTROABS 4.9 1.5* 5.4 8.8*  HGB 14.1 14.8 13.9 14.1  HCT 43.4 46.2 43.4 42.8  MCV 92.1 93.0 93.3 92.0  PLT 224 247 270 296   Basic Metabolic Panel: Recent Labs  Lab 02/01/20 1147 02/02/20 0447 02/03/20 0714 02/04/20 0507  NA 134* 138 138 139  K 4.3 4.7 4.9 4.3  CL 97* 103 104 104  CO2 26 24 25 25   GLUCOSE 135* 154* 158* 165*  BUN 17 20 25* 22*  CREATININE 1.38* 0.92 0.94 0.96  CALCIUM 8.5* 8.4* 8.2* 8.3*  MG  --  2.7* 3.2* 2.9*  PHOS  --  3.0  2.7 2.6   GFR: Estimated Creatinine Clearance: 94.3 mL/min (by C-G formula based on SCr of 0.96 mg/dL). Liver Function Tests: Recent Labs  Lab 02/01/20 1147 02/02/20 0447 02/03/20 0714 02/04/20 0507  AST 63* 49* 45* 117*  ALT 42 40 43 111*  ALKPHOS 58 58 51 60  BILITOT 0.7 0.6 0.7 0.9  PROT 7.6 7.8 7.1 7.0  ALBUMIN 3.5 3.2* 3.0* 3.1*   No results for input(s): LIPASE, AMYLASE in the last 168 hours. No results for input(s): AMMONIA in the last 168 hours. Coagulation Profile: No results for input(s): INR, PROTIME in the last 168 hours. Cardiac Enzymes: No results for input(s): CKTOTAL, CKMB, CKMBINDEX, TROPONINI in the last 168 hours. BNP (last 3 results) No results for input(s): PROBNP in the last 8760 hours. HbA1C: No results for input(s): HGBA1C in the last 72 hours. CBG: Recent Labs  Lab  02/03/20 2154 02/04/20 1113 02/04/20 1741 02/04/20 2315  GLUCAP 134* 123* 133* 119*   Lipid Profile: No results for input(s): CHOL, HDL, LDLCALC, TRIG, CHOLHDL, LDLDIRECT in the last 72 hours. Thyroid Function Tests: No results for input(s): TSH, T4TOTAL, FREET4, T3FREE, THYROIDAB in the last 72 hours. Anemia Panel: Recent Labs    02/03/20 0714 02/04/20 0507  FERRITIN 741* 650*   Sepsis Labs: Recent Labs  Lab 02/01/20 2003  PROCALCITON 0.42  LATICACIDVEN 1.1    Recent Results (from the past 240 hour(s))  Blood Culture (routine x 2)     Status: None (Preliminary result)   Collection Time: 02/01/20  2:44 PM   Specimen: BLOOD  Result Value Ref Range Status   Specimen Description BLOOD BLOOD LEFT ARM  Final   Special Requests   Final    BOTTLES DRAWN AEROBIC AND ANAEROBIC Blood Culture results may not be optimal due to an inadequate volume of blood received in culture bottles   Culture   Final    NO GROWTH 4 DAYS Performed at [facility], [address], [place], [place] [address]    Report Status PENDING  Incomplete  Blood Culture (routine x 2)     Status: None (Preliminary result)   Collection Time: 02/01/20  8:03 PM   Specimen: BLOOD  Result Value Ref Range Status   Specimen Description BLOOD BLOOD RIGHT HAND  Final   Special Requests   Final    BOTTLES DRAWN AEROBIC AND ANAEROBIC Blood Culture adequate volume   Culture   Final    NO GROWTH 4 DAYS Performed at [facility], [address] [place], [place] [address]    Report Status PENDING  Incomplete         Radiology Studies: CT ANGIO CHEST PE W OR WO CONTRAST  Result Date: 02/03/2020 CLINICAL DATA:  Shortness of breath.  COVID positive. EXAM: CT ANGIOGRAPHY CHEST WITH CONTRAST TECHNIQUE: Multidetector CT imaging of the chest was performed using the standard protocol during bolus administration of intravenous contrast. Multiplanar CT image reconstructions and MIPs were obtained to  evaluate the vascular anatomy. CONTRAST:  OMNIPAQUE IOHEXOL 350 MG/ML SOLN COMPARISON:  None. FINDINGS: Cardiovascular: Evaluation is limited secondary to respiratory motion, particularly in the bases. No pulmonary embolism through the segmental pulmonary arteries. Heart is at the upper limits of normal in size. LEFT-sided coronary artery atherosclerotic calcifications. Mediastinum/Nodes: Thyroid is unremarkable. No axillary adenopathy. No suspicious mediastinal adenopathy. Lungs/Pleura: No pleural effusion or pneumothorax. There are diffuse bilateral peripheral predominant ground-glass opacities which are nearly completely confluent within the bilateral lower lobes. Upper Abdomen: No  acute abnormality. Musculoskeletal: No acute osseous abnormality. Review of the MIP images confirms the above findings. IMPRESSION: 1. No evidence of acute pulmonary embolism through the segmental pulmonary arteries. 2. Diffuse bilateral peripheral predominant ground-glass opacities which are nearly completely confluent within the bilateral lower lobes. Findings are consistent with COVID-19 pneumonia. 3. LEFT-sided coronary artery atherosclerotic calcifications. Aortic Atherosclerosis (ICD10-I70.0). Electronically Signed   By: Meda Klinefelter MD   On: 02/03/2020 16:48        Scheduled Meds: . albuterol  2 puff Inhalation Q6H  . vitamin C  500 mg Oral Daily  . enoxaparin (LOVENOX) injection  40 mg Subcutaneous Q24H  . folic acid  1 mg Oral Daily  . guaiFENesin  600 mg Oral BID  . insulin aspart  0-9 Units Subcutaneous TID WC  . mouth rinse  15 mL Mouth Rinse BID  . methylPREDNISolone (SOLU-MEDROL) injection  80 mg Intravenous Q12H  . thiamine  100 mg Oral Daily  . zinc sulfate  220 mg Oral Daily   Continuous Infusions:   LOS: 4 days    Time spent: 30 mins     Charise Killian, MD [facility] Pager [phone]   02/05/2020, 8:28 AM

## 2020-02-05 NOTE — Progress Notes (Signed)
PT Cancellation Note  Patient Details Name: Jason Cowan MRN: [id number] DOB: 05/08/1970   Cancelled Treatment:    Reason Eval/Treat Not Completed: Medical issues which prohibited therapy (Chart reviewed for attempted treatment. Upon arrival to room, patient just finished using urinal.  Visibly SOB, sats 72% on HHFNC.  Coached through pursed lip breathing and relaxation with recovery to 80-82%.  Per RN, non -rebreather at 15L applied to patient; RN to call respiratory for additional assessment.  Sats recovered to 87-88% on HHFNC and non-rebreather.  Additional activity deferred at this time.  Will continue to follow and intervene as medically appropriate.)   Shavonda Wiedman H. Manson Passey, PT, DPT, NCS 02/05/20, 2:54 PM [phone]

## 2020-02-05 NOTE — Progress Notes (Signed)
Pt SpO2 noted to be sustaining in mid 80s, desating to as low as high 60s on 30L / 70% FIO@ HHFNC. Pt tachypneic and stated the he felt SOB and anxious. Attempted to have pt relax and focus on his breathing, pts sats inc to ~86% on HHFNC but still dropped down to low 80s, high 70s. Pt then placed on 15L NRB mask @ 15LPM. Pts SPO2 maintaining in mid to low 90s with HHFNC and NRB. RT notified of pts desaturation and application of NRB in addition to [facility]. No further actions at this time, will continue to monitor.

## 2020-02-06 DIAGNOSIS — J9601 Acute respiratory failure with hypoxia: Secondary | ICD-10-CM | POA: Diagnosis not present

## 2020-02-06 DIAGNOSIS — J1282 Pneumonia due to coronavirus disease 2019: Secondary | ICD-10-CM | POA: Diagnosis not present

## 2020-02-06 DIAGNOSIS — U071 COVID-19: Secondary | ICD-10-CM | POA: Diagnosis not present

## 2020-02-06 DIAGNOSIS — R7401 Elevation of levels of liver transaminase levels: Secondary | ICD-10-CM | POA: Diagnosis not present

## 2020-02-06 LAB — CBC WITH DIFFERENTIAL/PLATELET
Abs Immature Granulocytes: 0.44 10*3/uL — ABNORMAL HIGH (ref 0.00–0.07)
Basophils Absolute: 0 10*3/uL (ref 0.0–0.1)
Basophils Relative: 0 %
Eosinophils Absolute: 0 10*3/uL (ref 0.0–0.5)
Eosinophils Relative: 0 %
HCT: 45.7 % (ref 39.0–52.0)
Hemoglobin: 15 g/dL (ref 13.0–17.0)
Immature Granulocytes: 3 %
Lymphocytes Relative: 12 %
Lymphs Abs: 1.6 10*3/uL (ref 0.7–4.0)
MCH: 30 pg (ref 26.0–34.0)
MCHC: 32.8 g/dL (ref 30.0–36.0)
MCV: 91.4 fL (ref 80.0–100.0)
Monocytes Absolute: 0.7 10*3/uL (ref 0.1–1.0)
Monocytes Relative: 6 %
Neutro Abs: 10.2 10*3/uL — ABNORMAL HIGH (ref 1.7–7.7)
Neutrophils Relative %: 79 %
Platelets: 285 10*3/uL (ref 150–400)
RBC: 5 MIL/uL (ref 4.22–5.81)
RDW: 13.8 % (ref 11.5–15.5)
WBC: 13 10*3/uL — ABNORMAL HIGH (ref 4.0–10.5)
nRBC: 0.2 % (ref 0.0–0.2)

## 2020-02-06 LAB — COMPREHENSIVE METABOLIC PANEL
ALT: 64 U/L — ABNORMAL HIGH (ref 0–44)
AST: 22 U/L (ref 15–41)
Albumin: 3.2 g/dL — ABNORMAL LOW (ref 3.5–5.0)
Alkaline Phosphatase: 70 U/L (ref 38–126)
Anion gap: 11 (ref 5–15)
BUN: 28 mg/dL — ABNORMAL HIGH (ref 6–20)
CO2: 26 mmol/L (ref 22–32)
Calcium: 8.6 mg/dL — ABNORMAL LOW (ref 8.9–10.3)
Chloride: 102 mmol/L (ref 98–111)
Creatinine, Ser: 1 mg/dL (ref 0.61–1.24)
GFR, Estimated: >60 mL/min (ref >60)
Glucose, Bld: 152 mg/dL — ABNORMAL HIGH (ref 70–99)
Potassium: 4.8 mmol/L (ref 3.5–5.1)
Sodium: 139 mmol/L (ref 135–145)
Total Bilirubin: 1 mg/dL (ref 0.3–1.2)
Total Protein: 6.8 g/dL (ref 6.5–8.1)

## 2020-02-06 LAB — PHOSPHORUS: Phosphorus: 3.9 mg/dL (ref 2.5–4.6)

## 2020-02-06 LAB — GLUCOSE, CAPILLARY
Glucose-Capillary: 119 mg/dL — ABNORMAL HIGH (ref 70–99)
Glucose-Capillary: 160 mg/dL — ABNORMAL HIGH (ref 70–99)
Glucose-Capillary: 145 mg/dL — ABNORMAL HIGH (ref 70–99)
Glucose-Capillary: 151 mg/dL — ABNORMAL HIGH (ref 70–99)

## 2020-02-06 LAB — CULTURE, BLOOD (ROUTINE X 2)
Culture: NO GROWTH
Culture: NO GROWTH
Special Requests: ADEQUATE

## 2020-02-06 LAB — MAGNESIUM: Magnesium: 2.8 mg/dL — ABNORMAL HIGH (ref 1.7–2.4)

## 2020-02-06 LAB — EXPECTORATED SPUTUM ASSESSMENT W GRAM STAIN, RFLX TO RESP C

## 2020-02-06 LAB — D-DIMER, QUANTITATIVE: D-Dimer, Quant: >20 ug/mL-FEU — ABNORMAL HIGH (ref 0.00–0.50)

## 2020-02-06 LAB — C-REACTIVE PROTEIN: CRP: 0.6 mg/dL (ref <1.0)

## 2020-02-06 LAB — FERRITIN: Ferritin: 489 ng/mL — ABNORMAL HIGH (ref 24–336)

## 2020-02-06 MED ORDER — SALINE SPRAY 0.65 % NA SOLN
1.0000 | NASAL | Status: DC | PRN
  Filled 2020-02-06: qty 44

## 2020-02-06 NOTE — Plan of Care (Signed)

## 2020-02-06 NOTE — Progress Notes (Signed)
Patient has remained Prone most of the day. Oxygenation at or above 96% all day. Poor appetite. Ordered a replacement lunch tray of fruit per patient's request. Sputum Culture collected and sent to lab for analysis. Patient complained of bloody sputum and culture ordered by MD Mayford Knife. Patient has taken all medications today and more energetic today vs yesterday.

## 2020-02-06 NOTE — Progress Notes (Signed)
Physical Therapy Treatment Patient Details Name: Jason Cowan MRN: [id number] DOB: 02/17/1970 Today's Date: 02/06/2020    History of Present Illness 50 yo male with onset of SOB and acute respiratory failure after dx of Covid 19 was brought to ED for medical management.  Pt has been fully independent prior to this, was home until SOB was unmanageable.  Has cough, body aches, hypoxia, Covid PNA, hyperglycemia, elevated creatinine.  PMHx:  elevated BMI, HTN,    PT Comments    Patient resting in bed upon arrival to room; breathing much calmer, more controlled this date.  Agreeable to participation with session; does report some light mobility in room earlier this date. Tolerates approx 8-10 reps of each therex prior to need for rest period to recover breath support.  Sats fluctuate 78-90% throughout session on HHFNC, but recovers appropriately with intermittent rest periods.  Moderate anxiety noted as SOB increases; may benefit from coordinated anti-anxiety medications with therapy in subsequent sessions.     Follow Up Recommendations  Home health PT     Equipment Recommendations       Recommendations for Other Services       Precautions / Restrictions Precautions Precautions: Fall Restrictions Weight Bearing Restrictions: No    Mobility  Bed Mobility Overal bed mobility: Independent                Transfers Overall transfer level: Needs assistance Equipment used: None Transfers: Sit to/from Stand Sit to Stand: Supervision         General transfer comment: does utilize UE support/assist for optimal safety; good LE strength/control  Ambulation/Gait             General Gait Details: deferred due to SOB with standing, therex   Stairs             Wheelchair Mobility    Modified Rankin (Stroke Patients Only)       Balance Overall balance assessment: Needs assistance Sitting-balance support: No upper extremity supported;Feet  supported Sitting balance-Leahy Scale: Good     Standing balance support: No upper extremity supported Standing balance-Leahy Scale: Good                              Cognition Arousal/Alertness: Awake/alert Behavior During Therapy: Anxious Overall Cognitive Status: Within Functional Limits for tasks assessed                                        Exercises Other Exercises Other Exercises: Standing LE therex, 1x8-10 reps, close sup: heel raises, mini squats, lateral stepping, marching in place.  Instructed on breathing techniques throughout.  Tolerates approx 8-10 reps of each therex prior to need for rest period to recover breath support.  Moderate anxiety noted as SOB increases; may benefit from coordinated anti-anxiety medications with therapy in subsequent sessions.    General Comments        Pertinent Vitals/Pain Pain Assessment: No/denies pain    Home Living                      Prior Function            PT Goals (current goals can now be found in the care plan section) Acute Rehab PT Goals Patient Stated Goal: to get better, breathe better PT Goal Formulation: With patient Time For Goal  Achievement: 02/17/20 Potential to Achieve Goals: Good Progress towards PT goals: Progressing toward goals    Frequency    Min 2X/week      PT Plan Current plan remains appropriate    Co-evaluation              AM-PAC PT "6 Clicks" Mobility   Outcome Measure  Help needed turning from your back to your side while in a flat bed without using bedrails?: None Help needed moving from lying on your back to sitting on the side of a flat bed without using bedrails?: None Help needed moving to and from a bed to a chair (including a wheelchair)?: None Help needed standing up from a chair using your arms (e.g., wheelchair or bedside chair)?: A Little Help needed to walk in hospital room?: A Little Help needed climbing 3-5 steps with a  railing? : A Little 6 Click Score: 21    End of Session Equipment Utilized During Treatment: Oxygen Activity Tolerance: Patient tolerated treatment well Patient left: in bed;with call bell/phone within reach Nurse Communication: Mobility status PT Visit Diagnosis: Unsteadiness on feet (R26.81);Difficulty in walking, not elsewhere classified (R26.2);Dizziness and giddiness (R42)     Time: 0017-4944 PT Time Calculation (min) (ACUTE ONLY): 29 min  Charges:  $Therapeutic Exercise: 8-22 mins $Therapeutic Activity: 8-22 mins                    Kalana Yust H. Manson Passey, PT, DPT, NCS 02/06/20, 5:14 PM [phone]

## 2020-02-06 NOTE — Progress Notes (Signed)
PROGRESS NOTE    Jason Cowan  VOZ:[id number] DOB: Mar 03, 1970 DOA: 02/01/2020 PCP: Patient, No Pcp Per   Assessment & Plan:   Principal Problem:   Acute hypoxemic respiratory failure due to COVID-19 [facility]) Active Problems:   Pneumonia due to COVID-19 virus   HTN (hypertension)  Acute hypoxemic respiratory failure:  Slight improvement of respiratory status. Secondary to COVID19 pneumonia. Completed remdesivir course. Continue on IV steroids, zinc & vitamin C. S/p actemra 02/02/20. Continue on supplemental oxygen and wean as tolerated. Continue on bronchodilators & encourage incentive spirometry.   COVID19 pneumonia: unvaccinated. Management as tolerated   AKI: resolved  Leukopenia: resolved   Pre-DM: new onset w/ HbA1C 6.3. Continue on SSI w/ accuchecks   Transaminitis: secondary to COVID19.  DVT prophylaxis: lovenox Code Status: full  Family Communication:  Disposition Plan:  Likely home   Status is: Inpatient  Remains inpatient appropriate because:IV treatments appropriate due to intensity of illness or inability to take PO and Inpatient level of care appropriate due to severity of illness   Dispo: The patient is from: Home              Anticipated d/c is to: Home              Anticipated d/c date is: 3 days              Patient currently is not medically stable to d/c.     Consultants:      Procedures:    Antimicrobials:   Subjective: Pt c/o shortness of breath still  Objective: Vitals:   02/05/20 2042 02/06/20 0325 02/06/20 0408 02/06/20 0741  BP: (!) 147/99 123/89  (!) 137/91  Pulse: 78 70  62  Resp: 20 (!) 22  20  Temp: 98.7 F (37.1 C) 97.9 F (36.6 C)  98.3 F (36.8 C)  TempSrc: Oral Oral  Oral  SpO2: 90% 90% 91% 97%  Weight:  84.3 kg    Height:        Intake/Output Summary (Last 24 hours) at 02/06/2020 0836 Last data filed at 02/06/2020 0325 Gross per 24 hour  Intake 360 ml  Output 700 ml  Net -340 ml   Filed Weights    02/04/20 2310 02/05/20 0409 02/06/20 0325  Weight: 82.3 kg 85.2 kg 84.3 kg    Examination:  General exam: Appears calm but uncomfortable Respiratory system: diminished breath sounds b/l. No rales Cardiovascular system: S1/S2+. No rubs or gallops Gastrointestinal system: Abd is soft, NT, ND, & hypoactive bowel sounds Central nervous system: Alert and oriented. Moves all 4 extremities  Psychiatry: Judgement and insight appear normal. Flat mood and affect      Data Reviewed: I have personally reviewed following labs and imaging studies  CBC: Recent Labs  Lab 02/02/20 0447 02/03/20 0714 02/04/20 0507 02/05/20 0842 02/06/20 0353  WBC 2.8* 7.5 11.1* 10.1 13.0*  NEUTROABS 1.5* 5.4 8.8* 8.4* 10.2*  HGB 14.8 13.9 14.1 14.7 15.0  HCT 46.2 43.4 42.8 44.3 45.7  MCV 93.0 93.3 92.0 91.3 91.4  PLT 247 270 296 291 285   Basic Metabolic Panel: Recent Labs  Lab 02/02/20 0447 02/03/20 0714 02/04/20 0507 02/05/20 0842 02/06/20 0353  NA 138 138 139 137 139  K 4.7 4.9 4.3 4.8 4.8  CL 103 104 104 101 102  CO2 24 25 25 25 26   GLUCOSE 154* 158* 165* 150* 152*  BUN 20 25* 22* 25* 28*  CREATININE 0.92 0.94 0.96 1.01 1.00  CALCIUM 8.4* 8.2*  8.3* 8.7* 8.6*  MG 2.7* 3.2* 2.9* 2.8* 2.8*  PHOS 3.0 2.7 2.6 3.4 3.9   GFR: Estimated Creatinine Clearance: 90 mL/min (by C-G formula based on SCr of 1 mg/dL). Liver Function Tests: Recent Labs  Lab 02/02/20 0447 02/03/20 0714 02/04/20 0507 02/05/20 0842 02/06/20 0353  AST 49* 45* 117* 35 22  ALT 40 43 111* 84* 64*  ALKPHOS 58 51 60 64 70  BILITOT 0.6 0.7 0.9 0.9 1.0  PROT 7.8 7.1 7.0 6.7 6.8  ALBUMIN 3.2* 3.0* 3.1* 3.0* 3.2*   No results for input(s): LIPASE, AMYLASE in the last 168 hours. No results for input(s): AMMONIA in the last 168 hours. Coagulation Profile: No results for input(s): INR, PROTIME in the last 168 hours. Cardiac Enzymes: No results for input(s): CKTOTAL, CKMB, CKMBINDEX, TROPONINI in the last 168 hours. BNP  (last 3 results) No results for input(s): PROBNP in the last 8760 hours. HbA1C: No results for input(s): HGBA1C in the last 72 hours. CBG: Recent Labs  Lab 02/04/20 1741 02/04/20 2315 02/05/20 1618 02/05/20 1948 02/05/20 2043  GLUCAP 133* 119* 147* 124* 158*   Lipid Profile: No results for input(s): CHOL, HDL, LDLCALC, TRIG, CHOLHDL, LDLDIRECT in the last 72 hours. Thyroid Function Tests: No results for input(s): TSH, T4TOTAL, FREET4, T3FREE, THYROIDAB in the last 72 hours. Anemia Panel: Recent Labs    02/05/20 0842 02/06/20 0353  FERRITIN 488* 489*   Sepsis Labs: Recent Labs  Lab 02/01/20 2003  PROCALCITON 0.42  LATICACIDVEN 1.1    Recent Results (from the past 240 hour(s))  Blood Culture (routine x 2)     Status: None   Collection Time: 02/01/20  2:44 PM   Specimen: BLOOD  Result Value Ref Range Status   Specimen Description BLOOD BLOOD LEFT ARM  Final   Special Requests   Final    BOTTLES DRAWN AEROBIC AND ANAEROBIC Blood Culture results may not be optimal due to an inadequate volume of blood received in culture bottles   Culture   Final    NO GROWTH 5 DAYS Performed at [facility], [address], [place], [place] [address]    Report Status 02/06/2020 FINAL  Final  Blood Culture (routine x 2)     Status: None   Collection Time: 02/01/20  8:03 PM   Specimen: BLOOD  Result Value Ref Range Status   Specimen Description BLOOD BLOOD RIGHT HAND  Final   Special Requests   Final    BOTTLES DRAWN AEROBIC AND ANAEROBIC Blood Culture adequate volume   Culture   Final    NO GROWTH 5 DAYS Performed at [facility], [address], [place], [place] [address]    Report Status 02/06/2020 FINAL  Final         Radiology Studies: No results found.      Scheduled Meds: . albuterol  2 puff Inhalation Q6H  . vitamin C  500 mg Oral Daily  . enoxaparin (LOVENOX) injection  40 mg Subcutaneous Q24H  . folic acid  1 mg Oral Daily  .  guaiFENesin  600 mg Oral BID  . insulin aspart  0-9 Units Subcutaneous TID WC  . mouth rinse  15 mL Mouth Rinse BID  . methylPREDNISolone (SOLU-MEDROL) injection  80 mg Intravenous Q12H  . thiamine  100 mg Oral Daily  . zinc sulfate  220 mg Oral Daily   Continuous Infusions:   LOS: 5 days    Time spent: 33 mins  Charise Killian, MD [facility] Pager [phone]   02/06/2020, 8:36 AM

## 2020-02-07 DIAGNOSIS — R7401 Elevation of levels of liver transaminase levels: Secondary | ICD-10-CM | POA: Diagnosis not present

## 2020-02-07 DIAGNOSIS — U071 COVID-19: Secondary | ICD-10-CM | POA: Diagnosis not present

## 2020-02-07 DIAGNOSIS — J1282 Pneumonia due to coronavirus disease 2019: Secondary | ICD-10-CM | POA: Diagnosis not present

## 2020-02-07 DIAGNOSIS — J9601 Acute respiratory failure with hypoxia: Secondary | ICD-10-CM | POA: Diagnosis not present

## 2020-02-07 LAB — COMPREHENSIVE METABOLIC PANEL
ALT: 50 U/L — ABNORMAL HIGH (ref 0–44)
AST: 23 U/L (ref 15–41)
Albumin: 3.1 g/dL — ABNORMAL LOW (ref 3.5–5.0)
Alkaline Phosphatase: 64 U/L (ref 38–126)
Anion gap: 8 (ref 5–15)
BUN: 25 mg/dL — ABNORMAL HIGH (ref 6–20)
CO2: 27 mmol/L (ref 22–32)
Calcium: 8.7 mg/dL — ABNORMAL LOW (ref 8.9–10.3)
Chloride: 99 mmol/L (ref 98–111)
Creatinine, Ser: 1.09 mg/dL (ref 0.61–1.24)
GFR, Estimated: >60 mL/min (ref >60)
Glucose, Bld: 144 mg/dL — ABNORMAL HIGH (ref 70–99)
Potassium: 5.6 mmol/L — ABNORMAL HIGH (ref 3.5–5.1)
Sodium: 134 mmol/L — ABNORMAL LOW (ref 135–145)
Total Bilirubin: 1 mg/dL (ref 0.3–1.2)
Total Protein: 6.6 g/dL (ref 6.5–8.1)

## 2020-02-07 LAB — GLUCOSE, CAPILLARY
Glucose-Capillary: 109 mg/dL — ABNORMAL HIGH (ref 70–99)
Glucose-Capillary: 146 mg/dL — ABNORMAL HIGH (ref 70–99)
Glucose-Capillary: 124 mg/dL — ABNORMAL HIGH (ref 70–99)
Glucose-Capillary: 125 mg/dL — ABNORMAL HIGH (ref 70–99)

## 2020-02-07 LAB — CBC
HCT: 45.8 % (ref 39.0–52.0)
Hemoglobin: 14.8 g/dL (ref 13.0–17.0)
MCH: 30 pg (ref 26.0–34.0)
MCHC: 32.3 g/dL (ref 30.0–36.0)
MCV: 92.7 fL (ref 80.0–100.0)
Platelets: 286 10*3/uL (ref 150–400)
RBC: 4.94 MIL/uL (ref 4.22–5.81)
RDW: 13.7 % (ref 11.5–15.5)
WBC: 14 10*3/uL — ABNORMAL HIGH (ref 4.0–10.5)
nRBC: 0.3 % — ABNORMAL HIGH (ref 0.0–0.2)

## 2020-02-07 LAB — FERRITIN: Ferritin: 595 ng/mL — ABNORMAL HIGH (ref 24–336)

## 2020-02-07 LAB — C-REACTIVE PROTEIN: CRP: <0.5 mg/dL (ref <1.0)

## 2020-02-07 MED ORDER — DOCUSATE SODIUM 100 MG PO CAPS
200.0000 mg | ORAL_CAPSULE | Freq: Two times a day (BID) | ORAL | Status: DC
  Filled 2020-02-07 (×7): qty 2

## 2020-02-07 MED ORDER — PATIROMER SORBITEX CALCIUM 8.4 G PO PACK
8.4000 g | PACK | Freq: Every day | ORAL | Status: DC
  Administered 2020-02-07 – 2020-02-09 (×3): 8.4 g via ORAL
  Filled 2020-02-07 (×5): qty 1

## 2020-02-07 NOTE — Progress Notes (Signed)
PT Cancellation Note  Patient Details Name: Jason Cowan MRN: [id number] DOB: 1970-07-12   Cancelled Treatment:     Pt has K+ level of 5.6. Will hold PT per protocols and return when pt is more appropriate to participate.    [name] 02/07/2020, 2:04 PM

## 2020-02-07 NOTE — Progress Notes (Addendum)
PROGRESS NOTE    Jason Cowan  TIW:[id number] DOB: 1970/11/26 DOA: 02/01/2020 PCP: Patient, No Pcp Per   Assessment & Plan:   Principal Problem:   Acute hypoxemic respiratory failure due to COVID-19 [facility]) Active Problems:   Pneumonia due to COVID-19 virus   HTN (hypertension)  Acute hypoxemic respiratory failure:  dyspnea has improved as per pt but still on HFNC.  Secondary to COVID19 pneumonia. Completed remdesivir course. Continue on IV steroids, zinc, & vitamin C. S/p actemra 02/02/20. Continue on supplemental oxygen and wean as tolerated. Encourage incentive spirometry and continue w/ bronchodilators   Hyperkalemia: started on veltassa   COVID19 pneumonia: unvaccinated. Management as tolerated   AKI: resolved  Leukopenia: resolved   Pre-DM: new onset. HbA1c 6.3. Continue on SSI w/ accuchecks   Transaminitis: secondary to COVID19  DVT prophylaxis: lovenox Code Status: full  Family Communication:  Disposition Plan:  Likely home   Status is: Inpatient  Remains inpatient appropriate because:IV treatments appropriate due to intensity of illness or inability to take PO and Inpatient level of care appropriate due to severity of illness   Dispo: The patient is from: Home              Anticipated d/c is to: Home              Anticipated d/c date is: 3 days              Patient currently is not medically stable to d/c.     Consultants:      Procedures:    Antimicrobials:   Subjective: Pt c/o shortness of breath but improved from day prior   Objective: Vitals:   02/06/20 2205 02/07/20 0342 02/07/20 0538 02/07/20 0748  BP:  (!) 150/103  (!) 129/99  Pulse:  65  81  Resp:  (!) 21  18  Temp:  98.7 F (37.1 C)  98.7 F (37.1 C)  TempSrc:  Oral  Oral  SpO2: 91% 98% 93% 95%  Weight:  81.3 kg    Height:        Intake/Output Summary (Last 24 hours) at 02/07/2020 0802 Last data filed at 02/06/2020 1956 Gross per 24 hour  Intake 600 ml  Output 1400 ml   Net -800 ml   Filed Weights   02/05/20 0409 02/06/20 0325 02/07/20 0342  Weight: 85.2 kg 84.3 kg 81.3 kg    Examination:  General exam: Appears calm & comfortable  Respiratory system: decreased breath sounds b/l Cardiovascular system: S1 & S2 +. No clicks or rubs  Gastrointestinal system: Abd is soft, NT, ND & hypoactive bowel sounds  Central nervous system: Alert and oriented. Moves all 4 extremities  Psychiatry: Judgement and insight appear normal. Flat mood and affect      Data Reviewed: I have personally reviewed following labs and imaging studies  CBC: Recent Labs  Lab 02/02/20 0447 02/03/20 0714 02/04/20 0507 02/05/20 0842 02/06/20 0353 02/07/20 0331  WBC 2.8* 7.5 11.1* 10.1 13.0* 14.0*  NEUTROABS 1.5* 5.4 8.8* 8.4* 10.2*  --   HGB 14.8 13.9 14.1 14.7 15.0 14.8  HCT 46.2 43.4 42.8 44.3 45.7 45.8  MCV 93.0 93.3 92.0 91.3 91.4 92.7  PLT 247 270 296 291 285 286   Basic Metabolic Panel: Recent Labs  Lab 02/02/20 0447 02/03/20 0714 02/04/20 0507 02/05/20 0842 02/06/20 0353 02/07/20 0331  NA 138 138 139 137 139 134*  K 4.7 4.9 4.3 4.8 4.8 5.6*  CL 103 104 104 101 102 99  CO2 24 25 25 25 26 27   GLUCOSE 154* 158* 165* 150* 152* 144*  BUN 20 25* 22* 25* 28* 25*  CREATININE 0.92 0.94 0.96 1.01 1.00 1.09  CALCIUM 8.4* 8.2* 8.3* 8.7* 8.6* 8.7*  MG 2.7* 3.2* 2.9* 2.8* 2.8*  --   PHOS 3.0 2.7 2.6 3.4 3.9  --    GFR: Estimated Creatinine Clearance: 81.2 mL/min (by C-G formula based on SCr of 1.09 mg/dL). Liver Function Tests: Recent Labs  Lab 02/03/20 0714 02/04/20 0507 02/05/20 0842 02/06/20 0353 02/07/20 0331  AST 45* 117* 35 22 23  ALT 43 111* 84* 64* 50*  ALKPHOS 51 60 64 70 64  BILITOT 0.7 0.9 0.9 1.0 1.0  PROT 7.1 7.0 6.7 6.8 6.6  ALBUMIN 3.0* 3.1* 3.0* 3.2* 3.1*   No results for input(s): LIPASE, AMYLASE in the last 168 hours. No results for input(s): AMMONIA in the last 168 hours. Coagulation Profile: No results for input(s): INR, PROTIME  in the last 168 hours. Cardiac Enzymes: No results for input(s): CKTOTAL, CKMB, CKMBINDEX, TROPONINI in the last 168 hours. BNP (last 3 results) No results for input(s): PROBNP in the last 8760 hours. HbA1C: No results for input(s): HGBA1C in the last 72 hours. CBG: Recent Labs  Lab 02/06/20 0852 02/06/20 1304 02/06/20 1647 02/06/20 2117 02/07/20 0745  GLUCAP 119* 160* 145* 151* 109*   Lipid Profile: No results for input(s): CHOL, HDL, LDLCALC, TRIG, CHOLHDL, LDLDIRECT in the last 72 hours. Thyroid Function Tests: No results for input(s): TSH, T4TOTAL, FREET4, T3FREE, THYROIDAB in the last 72 hours. Anemia Panel: Recent Labs    02/06/20 0353 02/07/20 0331  FERRITIN 489* 595*   Sepsis Labs: Recent Labs  Lab 02/01/20 2003  PROCALCITON 0.42  LATICACIDVEN 1.1    Recent Results (from the past 240 hour(s))  Blood Culture (routine x 2)     Status: None   Collection Time: 02/01/20  2:44 PM   Specimen: BLOOD  Result Value Ref Range Status   Specimen Description BLOOD BLOOD LEFT ARM  Final   Special Requests   Final    BOTTLES DRAWN AEROBIC AND ANAEROBIC Blood Culture results may not be optimal due to an inadequate volume of blood received in culture bottles   Culture   Final    NO GROWTH 5 DAYS Performed at [facility], [address], [place], [place]    Report Status 02/06/2020 FINAL  Final  Blood Culture (routine x 2)     Status: None   Collection Time: 02/01/20  8:03 PM   Specimen: BLOOD  Result Value Ref Range Status   Specimen Description BLOOD BLOOD RIGHT HAND  Final   Special Requests   Final    BOTTLES DRAWN AEROBIC AND ANAEROBIC Blood Culture adequate volume   Culture   Final    NO GROWTH 5 DAYS Performed at [facility], [address] [place], [place]    Report Status 02/06/2020 FINAL  Final  Expectorated sputum assessment w rflx to resp cult     Status: None   Collection Time: 02/06/20 10:19 AM   Specimen:  Expectorated Sputum  Result Value Ref Range Status   Specimen Description EXPECTORATED SPUTUM  Final   Special Requests NONE  Final   Sputum evaluation   Final    THIS SPECIMEN IS ACCEPTABLE FOR SPUTUM CULTURE Performed at [facility], [address], [place], [place]    Report Status 02/06/2020 FINAL  Final  Culture,  respiratory     Status: None (Preliminary result)   Collection Time: 02/06/20 10:19 AM  Result Value Ref Range Status   Specimen Description   Final    EXPECTORATED SPUTUM Performed at [facility], [address] [place], [place] [address]    Special Requests   Final    NONE Reflexed from [id number] Performed at [facility], [address] [place], [place] [address]    Gram Stain   Final    RARE WBC PRESENT, PREDOMINANTLY PMN FEW GRAM POSITIVE COCCI IN PAIRS IN CLUSTERS RARE GRAM NEGATIVE RODS Performed at [facility], [address] [place], [place] [address]    Culture PENDING  Incomplete   Report Status PENDING  Incomplete         Radiology Studies: No results found.      Scheduled Meds: . albuterol  2 puff Inhalation Q6H  . vitamin C  500 mg Oral Daily  . enoxaparin (LOVENOX) injection  40 mg Subcutaneous Q24H  . folic acid  1 mg Oral Daily  . guaiFENesin  600 mg Oral BID  . insulin aspart  0-9 Units Subcutaneous TID WC  . mouth rinse  15 mL Mouth Rinse BID  . methylPREDNISolone (SOLU-MEDROL) injection  80 mg Intravenous Q12H  . patiromer  8.4 g Oral Daily  . thiamine  100 mg Oral Daily  . zinc sulfate  220 mg Oral Daily   Continuous Infusions:   LOS: 6 days    Time spent: 32 mins     Charise Killian, MD [facility] Pager [phone]   02/07/2020, 8:02 AM

## 2020-02-08 DIAGNOSIS — R7401 Elevation of levels of liver transaminase levels: Secondary | ICD-10-CM | POA: Diagnosis not present

## 2020-02-08 DIAGNOSIS — J9601 Acute respiratory failure with hypoxia: Secondary | ICD-10-CM | POA: Diagnosis not present

## 2020-02-08 DIAGNOSIS — J1282 Pneumonia due to coronavirus disease 2019: Secondary | ICD-10-CM | POA: Diagnosis not present

## 2020-02-08 DIAGNOSIS — U071 COVID-19: Secondary | ICD-10-CM | POA: Diagnosis not present

## 2020-02-08 LAB — GLUCOSE, CAPILLARY
Glucose-Capillary: 136 mg/dL — ABNORMAL HIGH (ref 70–99)
Glucose-Capillary: 165 mg/dL — ABNORMAL HIGH (ref 70–99)
Glucose-Capillary: 174 mg/dL — ABNORMAL HIGH (ref 70–99)
Glucose-Capillary: 133 mg/dL — ABNORMAL HIGH (ref 70–99)

## 2020-02-08 LAB — CBC
HCT: 46.8 % (ref 39.0–52.0)
Hemoglobin: 15.7 g/dL (ref 13.0–17.0)
MCH: 30.7 pg (ref 26.0–34.0)
MCHC: 33.5 g/dL (ref 30.0–36.0)
MCV: 91.4 fL (ref 80.0–100.0)
Platelets: 291 10*3/uL (ref 150–400)
RBC: 5.12 MIL/uL (ref 4.22–5.81)
RDW: 13.6 % (ref 11.5–15.5)
WBC: 17.7 10*3/uL — ABNORMAL HIGH (ref 4.0–10.5)
nRBC: 0.1 % (ref 0.0–0.2)

## 2020-02-08 LAB — COMPREHENSIVE METABOLIC PANEL
ALT: 44 U/L (ref 0–44)
AST: 20 U/L (ref 15–41)
Albumin: 3.1 g/dL — ABNORMAL LOW (ref 3.5–5.0)
Alkaline Phosphatase: 57 U/L (ref 38–126)
Anion gap: 9 (ref 5–15)
BUN: 28 mg/dL — ABNORMAL HIGH (ref 6–20)
CO2: 25 mmol/L (ref 22–32)
Calcium: 8.8 mg/dL — ABNORMAL LOW (ref 8.9–10.3)
Chloride: 101 mmol/L (ref 98–111)
Creatinine, Ser: 1.14 mg/dL (ref 0.61–1.24)
GFR, Estimated: >60 mL/min (ref >60)
Glucose, Bld: 141 mg/dL — ABNORMAL HIGH (ref 70–99)
Potassium: 5.4 mmol/L — ABNORMAL HIGH (ref 3.5–5.1)
Sodium: 135 mmol/L (ref 135–145)
Total Bilirubin: 1 mg/dL (ref 0.3–1.2)
Total Protein: 6.5 g/dL (ref 6.5–8.1)

## 2020-02-08 LAB — C-REACTIVE PROTEIN: CRP: <0.5 mg/dL (ref <1.0)

## 2020-02-08 LAB — FERRITIN: Ferritin: 500 ng/mL — ABNORMAL HIGH (ref 24–336)

## 2020-02-08 MED ORDER — POLYETHYLENE GLYCOL 3350 17 G PO PACK
17.0000 g | PACK | Freq: Every day | ORAL | Status: DC
  Filled 2020-02-08 (×2): qty 1

## 2020-02-08 MED ORDER — METHYLPREDNISOLONE SODIUM SUCC 125 MG IJ SOLR
60.0000 mg | Freq: Two times a day (BID) | INTRAMUSCULAR | Status: DC
  Administered 2020-02-08 – 2020-02-10 (×4): 60 mg via INTRAVENOUS
  Filled 2020-02-08 (×4): qty 2

## 2020-02-08 NOTE — Progress Notes (Signed)
PT Cancellation Note  Patient Details Name: Jason Cowan MRN: [id number] DOB: [birth year]   Cancelled Treatment:    Reason Eval/Treat Not Completed: Medical issues which prohibited therapy: Pt's Ka 5.4 falling outside guidelines for participation with PT services.  Will attempt to see pt at a future date/time as medically appropriate.     Ovidio Hanger PT, DPT 02/08/20, 2:07 PM

## 2020-02-08 NOTE — Progress Notes (Signed)
PROGRESS NOTE    Jason Cowan  TMA:[id number] DOB: 1970-10-03 DOA: 02/01/2020 PCP: Patient, No Pcp Per   Assessment & Plan:   Principal Problem:   Acute hypoxemic respiratory failure due to COVID-19 [facility]) Active Problems:   Pneumonia due to COVID-19 virus   HTN (hypertension)  Acute hypoxemic respiratory failure: shortness of breath has improved but still on HFNC, wean as tolerated.  Secondary to COVID19 pneumonia. Completed remdesivir course. Started IV steroid taper and continue on zinc & vitamin C. S/p actemra 02/02/20. Encourage incentive spirometry and continue on bronchodilators  Hyperkalemia: trending down but not WNL. Continue on veltassa   COVID19 pneumonia: unvaccinated. Management as tolerated   AKI: resolved  Leukopenia: resolved   Pre-DM: new onset HbA1c 6.3. Continue on SSI w/ accuchecks.   Transaminitis: secondary to COVID19  DVT prophylaxis: lovenox Code Status: full  Family Communication:  Disposition Plan:  Likely home   Status is: Inpatient  Remains inpatient appropriate because:IV treatments appropriate due to intensity of illness or inability to take PO and Inpatient level of care appropriate due to severity of illness, still on HFNC   Dispo: The patient is from: Home              Anticipated d/c is to: Home              Anticipated d/c date is: 3 days              Patient currently is not medically stable to d/c.     Consultants:      Procedures:    Antimicrobials:   Subjective: Pt c/o shortness of breath  Objective: Vitals:   02/08/20 0549 02/08/20 0908 02/08/20 1021 02/08/20 1257  BP: 125/86 129/88  108/80  Pulse: 72 67  78  Resp:      Temp: 98.7 F (37.1 C) 98.4 F (36.9 C)  98 F (36.7 C)  TempSrc:  Oral  Oral  SpO2: 96% 96% 97% 96%  Weight:      Height:        Intake/Output Summary (Last 24 hours) at 02/08/2020 1406 Last data filed at 02/08/2020 2563 Gross per 24 hour  Intake -  Output 600 ml  Net -600 ml    Filed Weights   02/06/20 0325 02/07/20 0342 02/08/20 0239  Weight: 84.3 kg 81.3 kg 82.6 kg    Examination:  General exam: Appears frustrated  Respiratory system: diminished breath sounds b/l  Cardiovascular system: S1/S2+. No rubs or clicks  Gastrointestinal system: Abd is soft, NT, & hypoactive bowel sounds  Central nervous system: Alert and oriented. Moves all 4 extremities  Psychiatry: Judgement and insight appear normal. Flat mood and affect      Data Reviewed: I have personally reviewed following labs and imaging studies  CBC: Recent Labs  Lab 02/02/20 0447 02/03/20 0714 02/04/20 0507 02/05/20 0842 02/06/20 0353 02/07/20 0331 02/08/20 0617  WBC 2.8* 7.5 11.1* 10.1 13.0* 14.0* 17.7*  NEUTROABS 1.5* 5.4 8.8* 8.4* 10.2*  --   --   HGB 14.8 13.9 14.1 14.7 15.0 14.8 15.7  HCT 46.2 43.4 42.8 44.3 45.7 45.8 46.8  MCV 93.0 93.3 92.0 91.3 91.4 92.7 91.4  PLT 247 270 296 291 285 286 291   Basic Metabolic Panel: Recent Labs  Lab 02/02/20 0447 02/03/20 0714 02/04/20 0507 02/05/20 0842 02/06/20 0353 02/07/20 0331 02/08/20 0617  NA 138 138 139 137 139 134* 135  K 4.7 4.9 4.3 4.8 4.8 5.6* 5.4*  CL 103 104  104 101 102 99 101  CO2 24 25 25 25 26 27 25   GLUCOSE 154* 158* 165* 150* 152* 144* 141*  BUN 20 25* 22* 25* 28* 25* 28*  CREATININE 0.92 0.94 0.96 1.01 1.00 1.09 1.14  CALCIUM 8.4* 8.2* 8.3* 8.7* 8.6* 8.7* 8.8*  MG 2.7* 3.2* 2.9* 2.8* 2.8*  --   --   PHOS 3.0 2.7 2.6 3.4 3.9  --   --    GFR: Estimated Creatinine Clearance: 78.2 mL/min (by C-G formula based on SCr of 1.14 mg/dL). Liver Function Tests: Recent Labs  Lab 02/04/20 0507 02/05/20 0842 02/06/20 0353 02/07/20 0331 02/08/20 0617  AST 117* 35 22 23 20   ALT 111* 84* 64* 50* 44  ALKPHOS 60 64 70 64 57  BILITOT 0.9 0.9 1.0 1.0 1.0  PROT 7.0 6.7 6.8 6.6 6.5  ALBUMIN 3.1* 3.0* 3.2* 3.1* 3.1*   No results for input(s): LIPASE, AMYLASE in the last 168 hours. No results for input(s): AMMONIA in the  last 168 hours. Coagulation Profile: No results for input(s): INR, PROTIME in the last 168 hours. Cardiac Enzymes: No results for input(s): CKTOTAL, CKMB, CKMBINDEX, TROPONINI in the last 168 hours. BNP (last 3 results) No results for input(s): PROBNP in the last 8760 hours. HbA1C: No results for input(s): HGBA1C in the last 72 hours. CBG: Recent Labs  Lab 02/07/20 1158 02/07/20 1651 02/07/20 2148 02/08/20 0906 02/08/20 1251  GLUCAP 146* 124* 125* 136* 165*   Lipid Profile: No results for input(s): CHOL, HDL, LDLCALC, TRIG, CHOLHDL, LDLDIRECT in the last 72 hours. Thyroid Function Tests: No results for input(s): TSH, T4TOTAL, FREET4, T3FREE, THYROIDAB in the last 72 hours. Anemia Panel: Recent Labs    02/07/20 0331 02/08/20 0617  FERRITIN 595* 500*   Sepsis Labs: Recent Labs  Lab 02/01/20 2003  PROCALCITON 0.42  LATICACIDVEN 1.1    Recent Results (from the past 240 hour(s))  Blood Culture (routine x 2)     Status: None   Collection Time: 02/01/20  2:44 PM   Specimen: BLOOD  Result Value Ref Range Status   Specimen Description BLOOD BLOOD LEFT ARM  Final   Special Requests   Final    BOTTLES DRAWN AEROBIC AND ANAEROBIC Blood Culture results may not be optimal due to an inadequate volume of blood received in culture bottles   Culture   Final    NO GROWTH 5 DAYS Performed at [facility], [address] [place], [address] [place]    Report Status 02/06/2020 FINAL  Final  Blood Culture (routine x 2)     Status: None   Collection Time: 02/01/20  8:03 PM   Specimen: BLOOD  Result Value Ref Range Status   Specimen Description BLOOD BLOOD RIGHT HAND  Final   Special Requests   Final    BOTTLES DRAWN AEROBIC AND ANAEROBIC Blood Culture adequate volume   Culture   Final    NO GROWTH 5 DAYS Performed at [facility], [address], [place], [address] [place]    Report Status 02/06/2020 FINAL  Final  Expectorated sputum assessment w rflx to  resp cult     Status: None   Collection Time: 02/06/20 10:19 AM   Specimen: Expectorated Sputum  Result Value Ref Range Status   Specimen Description EXPECTORATED SPUTUM  Final   Special Requests NONE  Final   Sputum evaluation   Final    THIS SPECIMEN IS ACCEPTABLE FOR SPUTUM CULTURE Performed at [facility], [address], [place], [place] [address]    Report Status 02/06/2020 FINAL  Final  Culture, respiratory     Status: None (Preliminary result)   Collection Time: 02/06/20 10:19 AM  Result Value Ref Range Status   Specimen Description   Final    EXPECTORATED SPUTUM Performed at [facility], [address] [place], [place] [address]    Special Requests   Final    NONE Reflexed from [id number] Performed at [facility], [address] [place], [place] [address]    Gram Stain   Final    RARE WBC PRESENT, PREDOMINANTLY PMN FEW GRAM POSITIVE COCCI IN PAIRS IN CLUSTERS RARE GRAM NEGATIVE RODS    Culture   Final    CULTURE REINCUBATED FOR BETTER GROWTH Performed at [facility], [address], [place], [place] [address]    Report Status PENDING  Incomplete         Radiology Studies: No results found.      Scheduled Meds: . albuterol  2 puff Inhalation Q6H  . vitamin C  500 mg Oral Daily  . docusate sodium  200 mg Oral BID  . enoxaparin (LOVENOX) injection  40 mg Subcutaneous Q24H  . folic acid  1 mg Oral Daily  . guaiFENesin  600 mg Oral BID  . insulin aspart  0-9 Units Subcutaneous TID WC  . mouth rinse  15 mL Mouth Rinse BID  . methylPREDNISolone (SOLU-MEDROL) injection  60 mg Intravenous Q12H  . patiromer  8.4 g Oral Daily  . polyethylene glycol  17 g Oral Daily  . thiamine  100 mg Oral Daily  . zinc sulfate  220 mg Oral Daily   Continuous Infusions:   LOS: 7 days    Time spent: 30 mins     Charise Killian, MD [facility] Pager [phone]   02/08/2020, 2:06 PM

## 2020-02-09 DIAGNOSIS — R7401 Elevation of levels of liver transaminase levels: Secondary | ICD-10-CM | POA: Diagnosis not present

## 2020-02-09 DIAGNOSIS — J9601 Acute respiratory failure with hypoxia: Secondary | ICD-10-CM | POA: Diagnosis not present

## 2020-02-09 DIAGNOSIS — J1282 Pneumonia due to coronavirus disease 2019: Secondary | ICD-10-CM | POA: Diagnosis not present

## 2020-02-09 DIAGNOSIS — U071 COVID-19: Secondary | ICD-10-CM | POA: Diagnosis not present

## 2020-02-09 LAB — CULTURE, RESPIRATORY W GRAM STAIN: Culture: NORMAL

## 2020-02-09 LAB — GLUCOSE, CAPILLARY
Glucose-Capillary: 157 mg/dL — ABNORMAL HIGH (ref 70–99)
Glucose-Capillary: 160 mg/dL — ABNORMAL HIGH (ref 70–99)
Glucose-Capillary: 172 mg/dL — ABNORMAL HIGH (ref 70–99)
Glucose-Capillary: 162 mg/dL — ABNORMAL HIGH (ref 70–99)

## 2020-02-09 LAB — CBC
HCT: 47.8 % (ref 39.0–52.0)
Hemoglobin: 15.8 g/dL (ref 13.0–17.0)
MCH: 30.4 pg (ref 26.0–34.0)
MCHC: 33.1 g/dL (ref 30.0–36.0)
MCV: 92.1 fL (ref 80.0–100.0)
Platelets: 290 10*3/uL (ref 150–400)
RBC: 5.19 MIL/uL (ref 4.22–5.81)
RDW: 13.7 % (ref 11.5–15.5)
WBC: 18.6 10*3/uL — ABNORMAL HIGH (ref 4.0–10.5)
nRBC: 0 % (ref 0.0–0.2)

## 2020-02-09 LAB — COMPREHENSIVE METABOLIC PANEL
ALT: 41 U/L (ref 0–44)
AST: 19 U/L (ref 15–41)
Albumin: 3.4 g/dL — ABNORMAL LOW (ref 3.5–5.0)
Alkaline Phosphatase: 58 U/L (ref 38–126)
Anion gap: 10 (ref 5–15)
BUN: 31 mg/dL — ABNORMAL HIGH (ref 6–20)
CO2: 21 mmol/L — ABNORMAL LOW (ref 22–32)
Calcium: 9 mg/dL (ref 8.9–10.3)
Chloride: 102 mmol/L (ref 98–111)
Creatinine, Ser: 1.04 mg/dL (ref 0.61–1.24)
GFR, Estimated: >60 mL/min (ref >60)
Glucose, Bld: 167 mg/dL — ABNORMAL HIGH (ref 70–99)
Potassium: 5.4 mmol/L — ABNORMAL HIGH (ref 3.5–5.1)
Sodium: 133 mmol/L — ABNORMAL LOW (ref 135–145)
Total Bilirubin: 1 mg/dL (ref 0.3–1.2)
Total Protein: 6.8 g/dL (ref 6.5–8.1)

## 2020-02-09 LAB — C-REACTIVE PROTEIN: CRP: 0.8 mg/dL (ref <1.0)

## 2020-02-09 LAB — FERRITIN: Ferritin: 503 ng/mL — ABNORMAL HIGH (ref 24–336)

## 2020-02-09 MED ORDER — FUROSEMIDE 10 MG/ML IJ SOLN
40.0000 mg | Freq: Once | INTRAMUSCULAR | Status: AC
  Administered 2020-02-09: 40 mg via INTRAVENOUS
  Filled 2020-02-09: qty 4

## 2020-02-09 NOTE — Progress Notes (Signed)
PROGRESS NOTE    Jason Cowan  UEA:[id number] DOB: Aug 11, 1970 DOA: 02/01/2020 PCP: Patient, No Pcp Per   Assessment & Plan:   Principal Problem:   Acute hypoxemic respiratory failure due to COVID-19 [facility]) Active Problems:   Pneumonia due to COVID-19 virus   HTN (hypertension)  Acute hypoxemic respiratory failure: still w/ shortness of breath & still on HFNC. Secondary to COVID19 pneumonia. Completed remdesivir course. Continue on IV steroid taper & continue on zinc, vitamin C. S/p actemra 02/02/20. Encourage incentive spirometry & continue on bronchodilators   Hyperkalemia: continue on veltassa & lasix. Cr is WNL   COVID19 pneumonia: unvaccinated. Management as tolerated   AKI: resolved  Leukopenia: resolved   Leukocytosis: likely secondary to steroid use.   Pre-DM: new onset HbA1c 6.3. Continue on SSI w/ accuchecks.   Transaminitis: secondary to COVID19  DVT prophylaxis: lovenox Code Status: full  Family Communication:  Disposition Plan:  Likely home   Status is: Inpatient  Remains inpatient appropriate because:IV treatments appropriate due to intensity of illness or inability to take PO and Inpatient level of care appropriate due to severity of illness, still on HFNC   Dispo: The patient is from: Home              Anticipated d/c is to: Home              Anticipated d/c date is: 3 days              Patient currently is not medically stable to d/c.     Consultants:      Procedures:    Antimicrobials:   Subjective: Pt c/o shortness of breath but improved from day prior   Objective: Vitals:   02/08/20 1808 02/08/20 2029 02/09/20 0523 02/09/20 0615  BP:  (!) 126/102 129/87   Pulse:  92 83   Resp:      Temp:  98.7 F (37.1 C) 98.2 F (36.8 C)   TempSrc:  Oral Oral   SpO2: 98% 96% 100%   Weight:    84.1 kg  Height:        Intake/Output Summary (Last 24 hours) at 02/09/2020 0803 Last data filed at 02/09/2020 0616 Gross per 24 hour  Intake  240 ml  Output 1175 ml  Net -935 ml   Filed Weights   02/07/20 0342 02/08/20 0239 02/09/20 0615  Weight: 81.3 kg 82.6 kg 84.1 kg    Examination:  General exam: Appears calm & comfortable  Respiratory system: decreased breath sounds b/l Cardiovascular system: S1 & S2+. No rubs or clicks   Gastrointestinal system: Abd is soft, NT, ND & normal bowel sounds  Central nervous system: Alert and oriented. Moves all 4 extremities  Psychiatry: Judgement and insight appear normal. Flat mood and affect      Data Reviewed: I have personally reviewed following labs and imaging studies  CBC: Recent Labs  Lab 02/03/20 0714 02/04/20 0507 02/05/20 0842 02/06/20 0353 02/07/20 0331 02/08/20 0617 02/09/20 0525  WBC 7.5 11.1* 10.1 13.0* 14.0* 17.7* 18.6*  NEUTROABS 5.4 8.8* 8.4* 10.2*  --   --   --   HGB 13.9 14.1 14.7 15.0 14.8 15.7 15.8  HCT 43.4 42.8 44.3 45.7 45.8 46.8 47.8  MCV 93.3 92.0 91.3 91.4 92.7 91.4 92.1  PLT 270 296 291 285 286 291 290   Basic Metabolic Panel: Recent Labs  Lab 02/03/20 0714 02/04/20 0507 02/05/20 0842 02/06/20 0353 02/07/20 0331 02/08/20 0617 02/09/20 0525  NA 138 139  137 139 134* 135 133*  K 4.9 4.3 4.8 4.8 5.6* 5.4* 5.4*  CL 104 104 101 102 99 101 102  CO2 25 25 25 26 27 25  21*  GLUCOSE 158* 165* 150* 152* 144* 141* 167*  BUN 25* 22* 25* 28* 25* 28* 31*  CREATININE 0.94 0.96 1.01 1.00 1.09 1.14 1.04  CALCIUM 8.2* 8.3* 8.7* 8.6* 8.7* 8.8* 9.0  MG 3.2* 2.9* 2.8* 2.8*  --   --   --   PHOS 2.7 2.6 3.4 3.9  --   --   --    GFR: Estimated Creatinine Clearance: 86.4 mL/min (by C-G formula based on SCr of 1.04 mg/dL). Liver Function Tests: Recent Labs  Lab 02/05/20 0842 02/06/20 0353 02/07/20 0331 02/08/20 0617 02/09/20 0525  AST 35 22 23 20 19   ALT 84* 64* 50* 44 41  ALKPHOS 64 70 64 57 58  BILITOT 0.9 1.0 1.0 1.0 1.0  PROT 6.7 6.8 6.6 6.5 6.8  ALBUMIN 3.0* 3.2* 3.1* 3.1* 3.4*   No results for input(s): LIPASE, AMYLASE in the last 168  hours. No results for input(s): AMMONIA in the last 168 hours. Coagulation Profile: No results for input(s): INR, PROTIME in the last 168 hours. Cardiac Enzymes: No results for input(s): CKTOTAL, CKMB, CKMBINDEX, TROPONINI in the last 168 hours. BNP (last 3 results) No results for input(s): PROBNP in the last 8760 hours. HbA1C: No results for input(s): HGBA1C in the last 72 hours. CBG: Recent Labs  Lab 02/07/20 2148 02/08/20 0906 02/08/20 1251 02/08/20 1644 02/08/20 2030  GLUCAP 125* 136* 165* 174* 133*   Lipid Profile: No results for input(s): CHOL, HDL, LDLCALC, TRIG, CHOLHDL, LDLDIRECT in the last 72 hours. Thyroid Function Tests: No results for input(s): TSH, T4TOTAL, FREET4, T3FREE, THYROIDAB in the last 72 hours. Anemia Panel: Recent Labs    02/08/20 0617 02/09/20 0525  FERRITIN 500* 503*   Sepsis Labs: No results for input(s): PROCALCITON, LATICACIDVEN in the last 168 hours.  Recent Results (from the past 240 hour(s))  Blood Culture (routine x 2)     Status: None   Collection Time: 02/01/20  2:44 PM   Specimen: BLOOD  Result Value Ref Range Status   Specimen Description BLOOD BLOOD LEFT ARM  Final   Special Requests   Final    BOTTLES DRAWN AEROBIC AND ANAEROBIC Blood Culture results may not be optimal due to an inadequate volume of blood received in culture bottles   Culture   Final    NO GROWTH 5 DAYS Performed at [facility], [address] [place], [address] [place]    Report Status 02/06/2020 FINAL  Final  Blood Culture (routine x 2)     Status: None   Collection Time: 02/01/20  8:03 PM   Specimen: BLOOD  Result Value Ref Range Status   Specimen Description BLOOD BLOOD RIGHT HAND  Final   Special Requests   Final    BOTTLES DRAWN AEROBIC AND ANAEROBIC Blood Culture adequate volume   Culture   Final    NO GROWTH 5 DAYS Performed at [facility], [address], [place], [address] [place]    Report Status 02/06/2020 FINAL   Final  Expectorated sputum assessment w rflx to resp cult     Status: None   Collection Time: 02/06/20 10:19 AM   Specimen: Expectorated Sputum  Result Value Ref Range Status   Specimen Description EXPECTORATED SPUTUM  Final   Special Requests NONE  Final   Sputum evaluation  Final    THIS SPECIMEN IS ACCEPTABLE FOR SPUTUM CULTURE Performed at [facility], [address] [place], [place] [address]    Report Status 02/06/2020 FINAL  Final  Culture, respiratory     Status: None (Preliminary result)   Collection Time: 02/06/20 10:19 AM  Result Value Ref Range Status   Specimen Description   Final    EXPECTORATED SPUTUM Performed at [facility], [address] [place], [place] [address]    Special Requests   Final    NONE Reflexed from [id number] Performed at [facility], [address] [place], [place] [address]    Gram Stain   Final    RARE WBC PRESENT, PREDOMINANTLY PMN FEW GRAM POSITIVE COCCI IN PAIRS IN CLUSTERS RARE GRAM NEGATIVE RODS    Culture   Final    CULTURE REINCUBATED FOR BETTER GROWTH Performed at [facility], [address] [place], [place] [address]    Report Status PENDING  Incomplete         Radiology Studies: No results found.      Scheduled Meds: . albuterol  2 puff Inhalation Q6H  . vitamin C  500 mg Oral Daily  . docusate sodium  200 mg Oral BID  . enoxaparin (LOVENOX) injection  40 mg Subcutaneous Q24H  . folic acid  1 mg Oral Daily  . furosemide  40 mg Intravenous Once  . guaiFENesin  600 mg Oral BID  . insulin aspart  0-9 Units Subcutaneous TID WC  . mouth rinse  15 mL Mouth Rinse BID  . methylPREDNISolone (SOLU-MEDROL) injection  60 mg Intravenous Q12H  . patiromer  8.4 g Oral Daily  . polyethylene glycol  17 g Oral Daily  . thiamine  100 mg Oral Daily  . zinc sulfate  220 mg Oral Daily   Continuous Infusions:   LOS: 8 days    Time spent: 31 mins     Charise Killian, MD [facility] Pager [phone]   02/09/2020, 8:03 AM

## 2020-02-09 NOTE — Progress Notes (Signed)
PT Cancellation Note  Patient Details Name: London Tarnowski MRN: [id number] DOB: [birth year]   Cancelled Treatment:    Reason Eval/Treat Not Completed: Medical issues which prohibited therapy; Pt's Ka 5.4 this date falling outside guidelines for participation with PT services.  Will attempt to see pt at a future date/time as medically appropriate.      Ovidio Hanger PT, DPT 02/09/20, 12:57 PM

## 2020-02-10 DIAGNOSIS — R7401 Elevation of levels of liver transaminase levels: Secondary | ICD-10-CM | POA: Diagnosis not present

## 2020-02-10 DIAGNOSIS — J1282 Pneumonia due to coronavirus disease 2019: Secondary | ICD-10-CM | POA: Diagnosis not present

## 2020-02-10 DIAGNOSIS — J9601 Acute respiratory failure with hypoxia: Secondary | ICD-10-CM | POA: Diagnosis not present

## 2020-02-10 DIAGNOSIS — U071 COVID-19: Secondary | ICD-10-CM | POA: Diagnosis not present

## 2020-02-10 LAB — COMPREHENSIVE METABOLIC PANEL
ALT: 31 U/L (ref 0–44)
AST: 16 U/L (ref 15–41)
Albumin: 3.2 g/dL — ABNORMAL LOW (ref 3.5–5.0)
Alkaline Phosphatase: 47 U/L (ref 38–126)
Anion gap: 10 (ref 5–15)
BUN: 35 mg/dL — ABNORMAL HIGH (ref 6–20)
CO2: 22 mmol/L (ref 22–32)
Calcium: 8.9 mg/dL (ref 8.9–10.3)
Chloride: 101 mmol/L (ref 98–111)
Creatinine, Ser: 1.1 mg/dL (ref 0.61–1.24)
GFR, Estimated: >60 mL/min (ref >60)
Glucose, Bld: 171 mg/dL — ABNORMAL HIGH (ref 70–99)
Potassium: 4.8 mmol/L (ref 3.5–5.1)
Sodium: 133 mmol/L — ABNORMAL LOW (ref 135–145)
Total Bilirubin: 0.9 mg/dL (ref 0.3–1.2)
Total Protein: 6.3 g/dL — ABNORMAL LOW (ref 6.5–8.1)

## 2020-02-10 LAB — CBC
HCT: 45.3 % (ref 39.0–52.0)
Hemoglobin: 15.1 g/dL (ref 13.0–17.0)
MCH: 30.8 pg (ref 26.0–34.0)
MCHC: 33.3 g/dL (ref 30.0–36.0)
MCV: 92.3 fL (ref 80.0–100.0)
Platelets: 277 10*3/uL (ref 150–400)
RBC: 4.91 MIL/uL (ref 4.22–5.81)
RDW: 13.8 % (ref 11.5–15.5)
WBC: 19.1 10*3/uL — ABNORMAL HIGH (ref 4.0–10.5)
nRBC: 0 % (ref 0.0–0.2)

## 2020-02-10 LAB — GLUCOSE, CAPILLARY
Glucose-Capillary: 154 mg/dL — ABNORMAL HIGH (ref 70–99)
Glucose-Capillary: 142 mg/dL — ABNORMAL HIGH (ref 70–99)
Glucose-Capillary: 153 mg/dL — ABNORMAL HIGH (ref 70–99)
Glucose-Capillary: 143 mg/dL — ABNORMAL HIGH (ref 70–99)

## 2020-02-10 LAB — FERRITIN: Ferritin: 496 ng/mL — ABNORMAL HIGH (ref 24–336)

## 2020-02-10 LAB — C-REACTIVE PROTEIN: CRP: 0.5 mg/dL (ref <1.0)

## 2020-02-10 MED ORDER — METHYLPREDNISOLONE SODIUM SUCC 40 MG IJ SOLR
40.0000 mg | Freq: Two times a day (BID) | INTRAMUSCULAR | Status: DC
  Administered 2020-02-10 – 2020-02-11 (×2): 40 mg via INTRAVENOUS
  Filled 2020-02-10 (×2): qty 1

## 2020-02-10 NOTE — Progress Notes (Signed)
PROGRESS NOTE    Jason Cowan  SNK:[id number] DOB: November 26, 1970 DOA: 02/01/2020 PCP: Patient, No Pcp Per   Assessment & Plan:   Principal Problem:   Acute hypoxemic respiratory failure due to COVID-19 [facility]) Active Problems:   Pneumonia due to COVID-19 virus   HTN (hypertension)  Acute hypoxemic respiratory failure: improved shortness of breath today. Secondary to COVID19 pneumonia. Completed remdesivir course. Continue on IV steroid taper & continue on zinc, vitamin C. S/p actemra 02/02/20. Encourage incentive spirometry & continue on bronchodilators   Hyperkalemia: resolved  COVID19 pneumonia: unvaccinated. Management as tolerated   AKI: resolved  Leukopenia: resolved   Leukocytosis: likely secondary to steroid use   Pre-DM: new onset, HbA1c 6.3. Continue on SSI w/ accuchecks   Transaminitis: secondary to COVID19.   DVT prophylaxis: lovenox Code Status: full  Family Communication:  Disposition Plan:  Likely home   Status is: Inpatient  Remains inpatient appropriate because:IV treatments appropriate due to intensity of illness or inability to take PO and Inpatient level of care appropriate due to severity of illness, still on a lot of supplemental oxygen    Dispo: The patient is from: Home              Anticipated d/c is to: Home              Anticipated d/c date is: 3 days              Patient currently is not medically stable to d/c.     Consultants:      Procedures:    Antimicrobials:   Subjective: Pt c/o difficulty breathing   Objective: Vitals:   02/09/20 1345 02/09/20 1739 02/09/20 2055 02/10/20 0526  BP:  127/89 121/82 110/76  Pulse:  82 89 79  Resp:  18 20 16   Temp:  97.6 F (36.4 C) 98.3 F (36.8 C) 98.6 F (37 C)  TempSrc:  Oral Oral   SpO2: 91% 93% 97% 99%  Weight:      Height:        Intake/Output Summary (Last 24 hours) at 02/10/2020 0807 Last data filed at 02/09/2020 1344 Gross per 24 hour  Intake -  Output 800 ml  Net  -800 ml   Filed Weights   02/07/20 0342 02/08/20 0239 02/09/20 0615  Weight: 81.3 kg 82.6 kg 84.1 kg    Examination:  General exam: Appears calm & comfortable  Respiratory system: diminished breath sounds b/l Cardiovascular system: S1 & S2+. No rubs or clicks   Gastrointestinal system: Abd is soft, NT, ND & normal bowel sounds  Central nervous system: Alert and oriented. Moves all 4 extremities  Psychiatry: Judgement and insight appear normal. Flat mood and affect    Data Reviewed: I have personally reviewed following labs and imaging studies  CBC: Recent Labs  Lab 02/04/20 0507 02/05/20 0842 02/06/20 0353 02/07/20 0331 02/08/20 0617 02/09/20 0525 02/10/20 0414  WBC 11.1* 10.1 13.0* 14.0* 17.7* 18.6* 19.1*  NEUTROABS 8.8* 8.4* 10.2*  --   --   --   --   HGB 14.1 14.7 15.0 14.8 15.7 15.8 15.1  HCT 42.8 44.3 45.7 45.8 46.8 47.8 45.3  MCV 92.0 91.3 91.4 92.7 91.4 92.1 92.3  PLT 296 291 285 286 291 290 277   Basic Metabolic Panel: Recent Labs  Lab 02/04/20 0507 02/05/20 0842 02/06/20 0353 02/07/20 0331 02/08/20 0617 02/09/20 0525 02/10/20 0414  NA 139 137 139 134* 135 133* 133*  K 4.3 4.8 4.8 5.6* 5.4*  5.4* 4.8  CL 104 101 102 99 101 102 101  CO2 25 25 26 27 25  21* 22  GLUCOSE 165* 150* 152* 144* 141* 167* 171*  BUN 22* 25* 28* 25* 28* 31* 35*  CREATININE 0.96 1.01 1.00 1.09 1.14 1.04 1.10  CALCIUM 8.3* 8.7* 8.6* 8.7* 8.8* 9.0 8.9  MG 2.9* 2.8* 2.8*  --   --   --   --   PHOS 2.6 3.4 3.9  --   --   --   --    GFR: Estimated Creatinine Clearance: 81.7 mL/min (by C-G formula based on SCr of 1.1 mg/dL). Liver Function Tests: Recent Labs  Lab 02/06/20 0353 02/07/20 0331 02/08/20 0617 02/09/20 0525 02/10/20 0414  AST 22 23 20 19 16   ALT 64* 50* 44 41 31  ALKPHOS 70 64 57 58 47  BILITOT 1.0 1.0 1.0 1.0 0.9  PROT 6.8 6.6 6.5 6.8 6.3*  ALBUMIN 3.2* 3.1* 3.1* 3.4* 3.2*   No results for input(s): LIPASE, AMYLASE in the last 168 hours. No results for  input(s): AMMONIA in the last 168 hours. Coagulation Profile: No results for input(s): INR, PROTIME in the last 168 hours. Cardiac Enzymes: No results for input(s): CKTOTAL, CKMB, CKMBINDEX, TROPONINI in the last 168 hours. BNP (last 3 results) No results for input(s): PROBNP in the last 8760 hours. HbA1C: No results for input(s): HGBA1C in the last 72 hours. CBG: Recent Labs  Lab 02/08/20 2030 02/09/20 0820 02/09/20 1157 02/09/20 1732 02/09/20 2056  GLUCAP 133* 157* 160* 172* 162*   Lipid Profile: No results for input(s): CHOL, HDL, LDLCALC, TRIG, CHOLHDL, LDLDIRECT in the last 72 hours. Thyroid Function Tests: No results for input(s): TSH, T4TOTAL, FREET4, T3FREE, THYROIDAB in the last 72 hours. Anemia Panel: Recent Labs    02/09/20 0525 02/10/20 0414  FERRITIN 503* 496*   Sepsis Labs: No results for input(s): PROCALCITON, LATICACIDVEN in the last 168 hours.  Recent Results (from the past 240 hour(s))  Blood Culture (routine x 2)     Status: None   Collection Time: 02/01/20  2:44 PM   Specimen: BLOOD  Result Value Ref Range Status   Specimen Description BLOOD BLOOD LEFT ARM  Final   Special Requests   Final    BOTTLES DRAWN AEROBIC AND ANAEROBIC Blood Culture results may not be optimal due to an inadequate volume of blood received in culture bottles   Culture   Final    NO GROWTH 5 DAYS Performed at [facility], [address], [place], [address] [place]    Report Status 02/06/2020 FINAL  Final  Blood Culture (routine x 2)     Status: None   Collection Time: 02/01/20  8:03 PM   Specimen: BLOOD  Result Value Ref Range Status   Specimen Description BLOOD BLOOD RIGHT HAND  Final   Special Requests   Final    BOTTLES DRAWN AEROBIC AND ANAEROBIC Blood Culture adequate volume   Culture   Final    NO GROWTH 5 DAYS Performed at [facility], [address] [place], [address] [place]    Report Status 02/06/2020 FINAL  Final  Expectorated sputum  assessment w rflx to resp cult     Status: None   Collection Time: 02/06/20 10:19 AM   Specimen: Expectorated Sputum  Result Value Ref Range Status   Specimen Description EXPECTORATED SPUTUM  Final   Special Requests NONE  Final   Sputum evaluation   Final    THIS SPECIMEN  IS ACCEPTABLE FOR SPUTUM CULTURE Performed at [facility], [address] [place], [place] [address]    Report Status 02/06/2020 FINAL  Final  Culture, respiratory     Status: None   Collection Time: 02/06/20 10:19 AM  Result Value Ref Range Status   Specimen Description   Final    EXPECTORATED SPUTUM Performed at [facility], [address] [place], [place] [address]    Special Requests   Final    NONE Reflexed from [id number] Performed at [facility], [address] [place], [place] [address]    Gram Stain   Final    RARE WBC PRESENT, PREDOMINANTLY PMN FEW GRAM POSITIVE COCCI IN PAIRS IN CLUSTERS RARE GRAM NEGATIVE RODS    Culture   Final    ABUNDANT Normal respiratory flora-no Staph aureus or Pseudomonas seen Performed at [facility], [address], [place], [place] [address]    Report Status 02/09/2020 FINAL  Final         Radiology Studies: No results found.      Scheduled Meds: . albuterol  2 puff Inhalation Q6H  . vitamin C  500 mg Oral Daily  . docusate sodium  200 mg Oral BID  . enoxaparin (LOVENOX) injection  40 mg Subcutaneous Q24H  . folic acid  1 mg Oral Daily  . guaiFENesin  600 mg Oral BID  . insulin aspart  0-9 Units Subcutaneous TID WC  . mouth rinse  15 mL Mouth Rinse BID  . methylPREDNISolone (SOLU-MEDROL) injection  60 mg Intravenous Q12H  . polyethylene glycol  17 g Oral Daily  . thiamine  100 mg Oral Daily  . zinc sulfate  220 mg Oral Daily   Continuous Infusions:   LOS: 9 days    Time spent: 30 mins     Charise Killian, MD [facility] Pager [phone]   02/10/2020, 8:07 AM

## 2020-02-11 DIAGNOSIS — J9601 Acute respiratory failure with hypoxia: Secondary | ICD-10-CM | POA: Diagnosis not present

## 2020-02-11 DIAGNOSIS — U071 COVID-19: Secondary | ICD-10-CM | POA: Diagnosis not present

## 2020-02-11 DIAGNOSIS — R7401 Elevation of levels of liver transaminase levels: Secondary | ICD-10-CM | POA: Diagnosis not present

## 2020-02-11 DIAGNOSIS — J1282 Pneumonia due to coronavirus disease 2019: Secondary | ICD-10-CM | POA: Diagnosis not present

## 2020-02-11 LAB — CBC
HCT: 43.9 % (ref 39.0–52.0)
Hemoglobin: 14.4 g/dL (ref 13.0–17.0)
MCH: 30.3 pg (ref 26.0–34.0)
MCHC: 32.8 g/dL (ref 30.0–36.0)
MCV: 92.4 fL (ref 80.0–100.0)
Platelets: 263 10*3/uL (ref 150–400)
RBC: 4.75 MIL/uL (ref 4.22–5.81)
RDW: 13.7 % (ref 11.5–15.5)
WBC: 19.5 10*3/uL — ABNORMAL HIGH (ref 4.0–10.5)
nRBC: 0 % (ref 0.0–0.2)

## 2020-02-11 LAB — GLUCOSE, CAPILLARY
Glucose-Capillary: 153 mg/dL — ABNORMAL HIGH (ref 70–99)
Glucose-Capillary: 154 mg/dL — ABNORMAL HIGH (ref 70–99)
Glucose-Capillary: 116 mg/dL — ABNORMAL HIGH (ref 70–99)
Glucose-Capillary: 132 mg/dL — ABNORMAL HIGH (ref 70–99)

## 2020-02-11 LAB — COMPREHENSIVE METABOLIC PANEL
ALT: 30 U/L (ref 0–44)
AST: 16 U/L (ref 15–41)
Albumin: 3 g/dL — ABNORMAL LOW (ref 3.5–5.0)
Alkaline Phosphatase: 48 U/L (ref 38–126)
Anion gap: 7 (ref 5–15)
BUN: 31 mg/dL — ABNORMAL HIGH (ref 6–20)
CO2: 23 mmol/L (ref 22–32)
Calcium: 8.5 mg/dL — ABNORMAL LOW (ref 8.9–10.3)
Chloride: 103 mmol/L (ref 98–111)
Creatinine, Ser: 1.1 mg/dL (ref 0.61–1.24)
GFR, Estimated: >60 mL/min (ref >60)
Glucose, Bld: 176 mg/dL — ABNORMAL HIGH (ref 70–99)
Potassium: 4.7 mmol/L (ref 3.5–5.1)
Sodium: 133 mmol/L — ABNORMAL LOW (ref 135–145)
Total Bilirubin: 0.7 mg/dL (ref 0.3–1.2)
Total Protein: 6.2 g/dL — ABNORMAL LOW (ref 6.5–8.1)

## 2020-02-11 LAB — FERRITIN: Ferritin: 492 ng/mL — ABNORMAL HIGH (ref 24–336)

## 2020-02-11 LAB — C-REACTIVE PROTEIN: CRP: 0.8 mg/dL (ref <1.0)

## 2020-02-11 MED ORDER — METHYLPREDNISOLONE SODIUM SUCC 40 MG IJ SOLR
40.0000 mg | Freq: Every day | INTRAMUSCULAR | Status: DC
  Administered 2020-02-12: 40 mg via INTRAVENOUS
  Filled 2020-02-11: qty 1

## 2020-02-11 NOTE — Progress Notes (Signed)
SATURATION QUALIFICATIONS: (This note is used to comply with regulatory documentation for home oxygen)  Patient Saturations on Room Air at Rest = 87%  Patient Saturations on Room Air while Ambulating = 81%  Patient Saturations on 2 Liters of oxygen while Ambulating = 92%  Please briefly explain why patient needs home oxygen: Oxygen demands increase with ambulation

## 2020-02-11 NOTE — Plan of Care (Signed)

## 2020-02-11 NOTE — Progress Notes (Signed)
PROGRESS NOTE    Jason Cowan  YPP:[id number] DOB: [birth year] DOA: 02/01/2020 PCP: Patient, No Pcp Per   Assessment & Plan:   Principal Problem:   Acute hypoxemic respiratory failure due to COVID-19 [facility]) Active Problems:   Pneumonia due to COVID-19 virus   HTN (hypertension)  Acute hypoxemic respiratory failure: improving slowly. Secondary to COVID19 pneumonia. Completed remdesivir course. Continue on IV steroid taper. S/p actemra 02/02/20. Encourage incentive spirometry & continue on bronchodilators. Oxygen desaturation test will be done today   Hyperkalemia: resolved  COVID19 pneumonia: unvaccinated. Management as stated above   AKI: resolved  Leukopenia: resolved   Leukocytosis: likely secondary to steroid use   Pre-DM: new onset, HbA1c 6.3. Continue on SSI w/ accuchecks. Will d/c home w/ metformin   Transaminitis: secondary to COVID19  DVT prophylaxis: lovenox Code Status: full  Family Communication: discussed pt's care w/ pt's family via phone and answered her questions  Disposition Plan:  Likely home   Status is: Inpatient  Remains inpatient appropriate because:IV treatments appropriate due to intensity of illness or inability to take PO and Inpatient level of care appropriate due to severity of illness, still on a lot of supplemental oxygen    Dispo: The patient is from: Home              Anticipated d/c is to: Home              Anticipated d/c date is: 1 or 2 days              Patient currently is not medically stable to d/c.     Consultants:      Procedures:    Antimicrobials:   Subjective: Pt c/o shortness of breath still   Objective: Vitals:   02/10/20 1413 02/10/20 1738 02/10/20 2015 02/11/20 0434  BP:  (!) 122/93 (!) 120/93 119/88  Pulse:  85 85 76  Resp:  18 16 16   Temp:  97.7 F (36.5 C) 97.7 F (36.5 C) 98 F (36.7 C)  TempSrc:  Oral Oral   SpO2: 93% 99% 98% 92%  Weight:    84.2 kg  Height:        Intake/Output  Summary (Last 24 hours) at 02/11/2020 0744 Last data filed at 02/10/2020 2200 Gross per 24 hour  Intake 240 ml  Output -  Net 240 ml   Filed Weights   02/08/20 0239 02/09/20 0615 02/11/20 0434  Weight: 82.6 kg 84.1 kg 84.2 kg    Examination:  General exam: Appears calm & comfortable Respiratory system: decreased breath sounds b/l  Cardiovascular system: S1/S2+. No rubs or gallops or clicks  Gastrointestinal system: Abd is soft, NT, ND & normal bowel sounds  Central nervous system: Alert and oriented. Moves all 4 extremities  Psychiatry: Judgement and insight appear normal. Flat mood and affect    Data Reviewed: I have personally reviewed following labs and imaging studies  CBC: Recent Labs  Lab 02/05/20 0842 02/06/20 0353 02/07/20 0331 02/08/20 0617 02/09/20 0525 02/10/20 0414 02/11/20 0649  WBC 10.1 13.0* 14.0* 17.7* 18.6* 19.1* 19.5*  NEUTROABS 8.4* 10.2*  --   --   --   --   --   HGB 14.7 15.0 14.8 15.7 15.8 15.1 14.4  HCT 44.3 45.7 45.8 46.8 47.8 45.3 43.9  MCV 91.3 91.4 92.7 91.4 92.1 92.3 92.4  PLT 291 285 286 291 290 277 263   Basic Metabolic Panel: Recent Labs  Lab 02/05/20 0842 02/06/20 0353 02/07/20 0331 02/08/20  0617 02/09/20 0525 02/10/20 0414  NA 137 139 134* 135 133* 133*  K 4.8 4.8 5.6* 5.4* 5.4* 4.8  CL 101 102 99 101 102 101  CO2 25 26 27 25  21* 22  GLUCOSE 150* 152* 144* 141* 167* 171*  BUN 25* 28* 25* 28* 31* 35*  CREATININE 1.01 1.00 1.09 1.14 1.04 1.10  CALCIUM 8.7* 8.6* 8.7* 8.8* 9.0 8.9  MG 2.8* 2.8*  --   --   --   --   PHOS 3.4 3.9  --   --   --   --    GFR: Estimated Creatinine Clearance: 81.8 mL/min (by C-G formula based on SCr of 1.1 mg/dL). Liver Function Tests: Recent Labs  Lab 02/06/20 0353 02/07/20 0331 02/08/20 0617 02/09/20 0525 02/10/20 0414  AST 22 23 20 19 16   ALT 64* 50* 44 41 31  ALKPHOS 70 64 57 58 47  BILITOT 1.0 1.0 1.0 1.0 0.9  PROT 6.8 6.6 6.5 6.8 6.3*  ALBUMIN 3.2* 3.1* 3.1* 3.4* 3.2*   No  results for input(s): LIPASE, AMYLASE in the last 168 hours. No results for input(s): AMMONIA in the last 168 hours. Coagulation Profile: No results for input(s): INR, PROTIME in the last 168 hours. Cardiac Enzymes: No results for input(s): CKTOTAL, CKMB, CKMBINDEX, TROPONINI in the last 168 hours. BNP (last 3 results) No results for input(s): PROBNP in the last 8760 hours. HbA1C: No results for input(s): HGBA1C in the last 72 hours. CBG: Recent Labs  Lab 02/09/20 2056 02/10/20 0855 02/10/20 1139 02/10/20 1736 02/10/20 2140  GLUCAP 162* 154* 142* 153* 143*   Lipid Profile: No results for input(s): CHOL, HDL, LDLCALC, TRIG, CHOLHDL, LDLDIRECT in the last 72 hours. Thyroid Function Tests: No results for input(s): TSH, T4TOTAL, FREET4, T3FREE, THYROIDAB in the last 72 hours. Anemia Panel: Recent Labs    02/09/20 0525 02/10/20 0414  FERRITIN 503* 496*   Sepsis Labs: No results for input(s): PROCALCITON, LATICACIDVEN in the last 168 hours.  Recent Results (from the past 240 hour(s))  Blood Culture (routine x 2)     Status: None   Collection Time: 02/01/20  2:44 PM   Specimen: BLOOD  Result Value Ref Range Status   Specimen Description BLOOD BLOOD LEFT ARM  Final   Special Requests   Final    BOTTLES DRAWN AEROBIC AND ANAEROBIC Blood Culture results may not be optimal due to an inadequate volume of blood received in culture bottles   Culture   Final    NO GROWTH 5 DAYS Performed at [facility], [address], [place], [address] [place]    Report Status 02/06/2020 FINAL  Final  Blood Culture (routine x 2)     Status: None   Collection Time: 02/01/20  8:03 PM   Specimen: BLOOD  Result Value Ref Range Status   Specimen Description BLOOD BLOOD RIGHT HAND  Final   Special Requests   Final    BOTTLES DRAWN AEROBIC AND ANAEROBIC Blood Culture adequate volume   Culture   Final    NO GROWTH 5 DAYS Performed at [facility], [address]  [place], [address] [place]    Report Status 02/06/2020 FINAL  Final  Expectorated sputum assessment w rflx to resp cult     Status: None   Collection Time: 02/06/20 10:19 AM   Specimen: Expectorated Sputum  Result Value Ref Range Status   Specimen Description EXPECTORATED SPUTUM  Final   Special Requests NONE  Final  Sputum evaluation   Final    THIS SPECIMEN IS ACCEPTABLE FOR SPUTUM CULTURE Performed at [facility], [address] [place], [place] [address]    Report Status 02/06/2020 FINAL  Final  Culture, respiratory     Status: None   Collection Time: 02/06/20 10:19 AM  Result Value Ref Range Status   Specimen Description   Final    EXPECTORATED SPUTUM Performed at [facility], [address] [place], [place] [address]    Special Requests   Final    NONE Reflexed from [id number] Performed at [facility], [address] [place], [place] [address]    Gram Stain   Final    RARE WBC PRESENT, PREDOMINANTLY PMN FEW GRAM POSITIVE COCCI IN PAIRS IN CLUSTERS RARE GRAM NEGATIVE RODS    Culture   Final    ABUNDANT Normal respiratory flora-no Staph aureus or Pseudomonas seen Performed at [facility], [address], [place], [place] [address]    Report Status 02/09/2020 FINAL  Final         Radiology Studies: No results found.      Scheduled Meds: . albuterol  2 puff Inhalation Q6H  . vitamin C  500 mg Oral Daily  . docusate sodium  200 mg Oral BID  . enoxaparin (LOVENOX) injection  40 mg Subcutaneous Q24H  . folic acid  1 mg Oral Daily  . guaiFENesin  600 mg Oral BID  . insulin aspart  0-9 Units Subcutaneous TID WC  . mouth rinse  15 mL Mouth Rinse BID  . methylPREDNISolone (SOLU-MEDROL) injection  40 mg Intravenous Q12H  . polyethylene glycol  17 g Oral Daily  . thiamine  100 mg Oral Daily  . zinc sulfate  220 mg Oral Daily   Continuous Infusions:   LOS: 10 days    Time spent: 33 mins     Charise Killian, MD [facility] Pager [phone]   02/11/2020, 7:44 AM

## 2020-02-11 NOTE — Progress Notes (Signed)
PT Cancellation Note  Patient Details Name: Jason Cowan MRN: [id number] DOB: 02/12/1970   Cancelled Treatment:    Reason Eval/Treat Not Completed: Patient declined, no reason specified. Patient declined PT at this time due to wanting to sleep. He asked PT to return later. PT will continue with attempts as able.   Donna Bernard, PT, MPT  Ina Homes 02/11/2020, 1:21 PM

## 2020-02-12 DIAGNOSIS — I10 Essential (primary) hypertension: Secondary | ICD-10-CM

## 2020-02-12 LAB — GLUCOSE, CAPILLARY
Glucose-Capillary: 106 mg/dL — ABNORMAL HIGH (ref 70–99)
Glucose-Capillary: 173 mg/dL — ABNORMAL HIGH (ref 70–99)
Glucose-Capillary: 162 mg/dL — ABNORMAL HIGH (ref 70–99)
Glucose-Capillary: 150 mg/dL — ABNORMAL HIGH (ref 70–99)

## 2020-02-12 MED ORDER — METFORMIN HCL 500 MG PO TABS: 500.0000 mg | ORAL_TABLET | Freq: Two times a day (BID) | ORAL | 0 refills | Status: DC

## 2020-02-12 MED ORDER — PREDNISONE 20 MG PO TABS: 40.0000 mg | ORAL_TABLET | Freq: Every day | ORAL | 0 refills | Status: DC

## 2020-02-12 NOTE — TOC Progression Note (Signed)
Transition of Care [facility]) - Progression Note    Patient Details  Name: Jason Cowan MRN: [id number] Date of Birth: 09/18/1970  Transition of Care [facility]) CM/SW Contact  Maree Krabbe, LCSW Phone Number: 02/12/2020, 2:55 PM  Clinical Narrative:  [facility] transport arrived, unable to wait on pt to get downstairs, they left. The next pickup time available is 7:00. RN notified.     Expected Discharge Plan: Home/Self Care Barriers to Discharge: No Barriers Identified  Expected Discharge Plan and Services Expected Discharge Plan: Home/Self Care     Post Acute Care Choice: NA Living arrangements for the past 2 months: Single Family Home Expected Discharge Date: 02/12/20               DME Arranged: Oxygen DME Agency: [employer] Date DME Agency Contacted: 02/12/20 Time DME Agency Contacted: [id number] Representative spoke with at DME Agency: [email or web address]             Social Determinants of Health (SDOH) Interventions    Readmission Risk Interventions No flowsheet data found.

## 2020-02-12 NOTE — Discharge Summary (Signed)
Physician Discharge Summary  Jason Cowan PIR:[id number] DOB: 08/14/1970 DOA: 02/01/2020  PCP: Patient, No Pcp Per  Admit date: 02/01/2020 Discharge date: 02/12/2020  Admitted From: home Disposition:  Home   Recommendations for Outpatient Follow-up:  1. Follow up with PCP in 1-2 weeks   Home Health: no Equipment/Devices: 2L [place]  Discharge Condition: stable  CODE STATUS: full  Diet recommendation: carb modified   Brief/Interim Summary: HPI was taken from Dr. Ashok Pall: Jason Cowan is a 50 y.o. male with medical history significant for htn presenting w/ the above.  Unvaccinated  Symptoms began 1/1.  Initially eye pain and headache. Progressed to body aches, cough, and loose stools. For past few days increasing shortness of breath which prompted him to come here. No vomiting, is tolerating liquids. No leg swelling or chest pain. No abdominal pain. Occasional cigar use, otherwise denies toxic habits. Denies wheeze or history asthma/copd. [facility]ed [facility]/3 [facility] (results visible in care everywhere)  ED Course:   EMS reports O2 of 87%, improved to normal w/ 2 L. Labs drawn, decadron ordered. CXR consistent w/ covid pneumonia.   Hospital course from Dr. Mayford Knife 1/11-1/18/22: Pt was found to have COVID19 pneumonia and was treated w/ remdesivir, steroids, actemra, incentive spirometry, bronchodilators and supplemental oxygen. Pt was unable to be weaned off of supplemental oxygen and was d/c home w/ 2L [place]. Of note, pt was found to have pre-DM w/ HbA1c 6.3 and received sq insulin while inpatient but was d/c home po metformin.   Discharge Diagnoses:  Principal Problem:   Acute hypoxemic respiratory failure due to COVID-19 [facility]) Active Problems:   Pneumonia due to COVID-19 virus   HTN (hypertension)  Acute hypoxemic respiratory failure: improving slowly. Secondary to COVID19 pneumonia. Completed remdesivir course. Continue on IV steroid taper. S/p actemra 02/02/20.  Encourage incentive spirometry & continue on bronchodilators. Oxygen desaturation test will be done today   Hyperkalemia: resolved  COVID19 pneumonia: unvaccinated. Management as stated above   AKI: resolved  Leukopenia: resolved   Leukocytosis: likely secondary to steroid use   Pre-DM: new onset, HbA1c 6.3. Continue on SSI w/ accuchecks. Will d/c home w/ metformin   Transaminitis: secondary to COVID19   Discharge Instructions  Discharge Instructions    Diet Carb Modified   Complete by: As directed    Discharge instructions   Complete by: As directed    F/u w/ PCP in 1-2 weeks and PCP will perform another oxygen desaturation test to determine if you still need supplemental oxygen or not. Will need to get a repeat HbA1c in 3 months. HbA1c 6.3 (in the hospital) is pre-diabetes. Started on metformin for pre-diabetes   Increase activity slowly   Complete by: As directed      Allergies as of 02/12/2020   No Known Allergies     Medication List    TAKE these medications   metFORMIN 500 MG tablet Commonly known as: Glucophage Take 1 tablet (500 mg total) by mouth 2 (two) times daily with a meal.   multivitamin with minerals Tabs tablet Take 1 tablet by mouth daily.   predniSONE 20 MG tablet Commonly known as: DELTASONE Take 2 tablets (40 mg total) by mouth daily for 4 days.            Durable Medical Equipment  (From admission, onward)         Start     Ordered   02/12/20 1049  For home use only DME oxygen  Once  Question Answer Comment  Length of Need 6 Months   Mode or (Route) Nasal cannula   Liters per Minute 2   Frequency Continuous (stationary and portable oxygen unit needed)   Oxygen delivery system Gas      02/12/20 1048          No Known Allergies  Consultations:     Procedures/Studies: DG Chest 2 View  Result Date: 02/01/2020 CLINICAL DATA:  Short of breath.  COVID infection EXAM: CHEST - 2 VIEW COMPARISON:  None. FINDINGS: Normal  cardiac silhouette. Bilateral patchy airspace disease with a lower lobe predominance. No pleural fluid. No pneumothorax. IMPRESSION: Moderate severity bilateral viral COVID pneumonia. Electronically Signed   By: Genevive Bi M.D.   On: 02/01/2020 12:28   CT ANGIO CHEST PE W OR WO CONTRAST  Result Date: 02/03/2020 CLINICAL DATA:  Shortness of breath.  COVID positive. EXAM: CT ANGIOGRAPHY CHEST WITH CONTRAST TECHNIQUE: Multidetector CT imaging of the chest was performed using the standard protocol during bolus administration of intravenous contrast. Multiplanar CT image reconstructions and MIPs were obtained to evaluate the vascular anatomy. CONTRAST:  OMNIPAQUE IOHEXOL 350 MG/ML SOLN COMPARISON:  None. FINDINGS: Cardiovascular: Evaluation is limited secondary to respiratory motion, particularly in the bases. No pulmonary embolism through the segmental pulmonary arteries. Heart is at the upper limits of normal in size. LEFT-sided coronary artery atherosclerotic calcifications. Mediastinum/Nodes: Thyroid is unremarkable. No axillary adenopathy. No suspicious mediastinal adenopathy. Lungs/Pleura: No pleural effusion or pneumothorax. There are diffuse bilateral peripheral predominant ground-glass opacities which are nearly completely confluent within the bilateral lower lobes. Upper Abdomen: No acute abnormality. Musculoskeletal: No acute osseous abnormality. Review of the MIP images confirms the above findings. IMPRESSION: 1. No evidence of acute pulmonary embolism through the segmental pulmonary arteries. 2. Diffuse bilateral peripheral predominant ground-glass opacities which are nearly completely confluent within the bilateral lower lobes. Findings are consistent with COVID-19 pneumonia. 3. LEFT-sided coronary artery atherosclerotic calcifications. Aortic Atherosclerosis (ICD10-I70.0). Electronically Signed   By: Meda Klinefelter MD   On: 02/03/2020 16:48     Subjective: Pt c/o  fatigue   Discharge Exam: Vitals:   02/12/20 0720 02/12/20 1308  BP: 114/80 109/84  Pulse: 75 84  Resp: 18 18  Temp: 98.7 F (37.1 C) 98 F (36.7 C)  SpO2: 90% 96%   Vitals:   02/12/20 0438 02/12/20 0539 02/12/20 0720 02/12/20 1308  BP: 98/80  114/80 109/84  Pulse: 80  75 84  Resp:   18 18  Temp: 98.5 F (36.9 C)  98.7 F (37.1 C) 98 F (36.7 C)  TempSrc: Oral  Oral Oral  SpO2: 94%  90% 96%  Weight:  80.2 kg    Height:        General: Pt is alert, awake, not in acute distress Cardiovascular:  S1/S2 +, no rubs, no gallops Respiratory: diminished breath sounds otherwise clear Abdominal: Soft, NT, ND, bowel sounds + Extremities:  no cyanosis    The results of significant diagnostics from this hospitalization (including imaging, microbiology, ancillary and laboratory) are listed below for reference.     Microbiology: Recent Results (from the past 240 hour(s))  Expectorated sputum assessment w rflx to resp cult     Status: None   Collection Time: 02/06/20 10:19 AM   Specimen: Expectorated Sputum  Result Value Ref Range Status   Specimen Description EXPECTORATED SPUTUM  Final   Special Requests NONE  Final   Sputum evaluation   Final    THIS SPECIMEN IS ACCEPTABLE  FOR SPUTUM CULTURE Performed at [facility], [address] [place] [place], [place] [address]    Report Status 02/06/2020 FINAL  Final  Culture, respiratory     Status: None   Collection Time: 02/06/20 10:19 AM  Result Value Ref Range Status   Specimen Description   Final    EXPECTORATED SPUTUM Performed at [facility], [address] [place], [place] [address]    Special Requests   Final    NONE Reflexed from [id number] Performed at [facility], [address] [place], [place] [address]    Gram Stain   Final    RARE WBC PRESENT, PREDOMINANTLY PMN FEW GRAM POSITIVE COCCI IN PAIRS IN CLUSTERS RARE GRAM NEGATIVE RODS    Culture   Final    ABUNDANT Normal respiratory  flora-no Staph aureus or Pseudomonas seen Performed at [facility], [address], [place], [place] [address]    Report Status 02/09/2020 FINAL  Final     Labs: BNP (last 3 results) No results for input(s): BNP in the last 8760 hours. Basic Metabolic Panel: Recent Labs  Lab 02/06/20 0353 02/07/20 0331 02/08/20 0617 02/09/20 0525 02/10/20 0414 02/11/20 0649  NA 139 134* 135 133* 133* 133*  K 4.8 5.6* 5.4* 5.4* 4.8 4.7  CL 102 99 101 102 101 103  CO2 26 27 25  21* 22 23  GLUCOSE 152* 144* 141* 167* 171* 176*  BUN 28* 25* 28* 31* 35* 31*  CREATININE 1.00 1.09 1.14 1.04 1.10 1.10  CALCIUM 8.6* 8.7* 8.8* 9.0 8.9 8.5*  MG 2.8*  --   --   --   --   --   PHOS 3.9  --   --   --   --   --    Liver Function Tests: Recent Labs  Lab 02/07/20 0331 02/08/20 0617 02/09/20 0525 02/10/20 0414 02/11/20 0649  AST 23 20 19 16 16   ALT 50* 44 41 31 30  ALKPHOS 64 57 58 47 48  BILITOT 1.0 1.0 1.0 0.9 0.7  PROT 6.6 6.5 6.8 6.3* 6.2*  ALBUMIN 3.1* 3.1* 3.4* 3.2* 3.0*   No results for input(s): LIPASE, AMYLASE in the last 168 hours. No results for input(s): AMMONIA in the last 168 hours. CBC: Recent Labs  Lab 02/06/20 0353 02/07/20 0331 02/08/20 0617 02/09/20 0525 02/10/20 0414 02/11/20 0649  WBC 13.0* 14.0* 17.7* 18.6* 19.1* 19.5*  NEUTROABS 10.2*  --   --   --   --   --   HGB 15.0 14.8 15.7 15.8 15.1 14.4  HCT 45.7 45.8 46.8 47.8 45.3 43.9  MCV 91.4 92.7 91.4 92.1 92.3 92.4  PLT 285 286 291 290 277 263   Cardiac Enzymes: No results for input(s): CKTOTAL, CKMB, CKMBINDEX, TROPONINI in the last 168 hours. BNP: Invalid input(s): POCBNP CBG: Recent Labs  Lab 02/11/20 1706 02/11/20 2045 02/12/20 0838 02/12/20 0909 02/12/20 1230  GLUCAP 116* 132* 173* 106* 162*   D-Dimer No results for input(s): DDIMER in the last 72 hours. Hgb A1c No results for input(s): HGBA1C in the last 72 hours. Lipid Profile No results for input(s): CHOL, HDL, LDLCALC, TRIG, CHOLHDL,  LDLDIRECT in the last 72 hours. Thyroid function studies No results for input(s): TSH, T4TOTAL, T3FREE, THYROIDAB in the last 72 hours.  Invalid input(s): FREET3 Anemia work up Recent Labs    02/10/20 0414 02/11/20 0649  FERRITIN 496* 492*   Urinalysis No results found for: COLORURINE, APPEARANCEUR, LABSPEC, PHURINE, GLUCOSEU, HGBUR, BILIRUBINUR,  Lavenia AtlasKETONESUR, PROTEINUR, UROBILINOGEN, NITRITE, LEUKOCYTESUR Sepsis Labs Invalid input(s): PROCALCITONIN,  WBC,  LACTICIDVEN Microbiology Recent Results (from the past 240 hour(s))  Expectorated sputum assessment w rflx to resp cult     Status: None   Collection Time: 02/06/20 10:19 AM   Specimen: Expectorated Sputum  Result Value Ref Range Status   Specimen Description EXPECTORATED SPUTUM  Final   Special Requests NONE  Final   Sputum evaluation   Final    THIS SPECIMEN IS ACCEPTABLE FOR SPUTUM CULTURE Performed at [facility], [address] [place], [place] [address]    Report Status 02/06/2020 FINAL  Final  Culture, respiratory     Status: None   Collection Time: 02/06/20 10:19 AM  Result Value Ref Range Status   Specimen Description   Final    EXPECTORATED SPUTUM Performed at [facility], [address] [place], [place] [address]    Special Requests   Final    NONE Reflexed from [id number] Performed at [facility], [address] [place], [place] [address]    Gram Stain   Final    RARE WBC PRESENT, PREDOMINANTLY PMN FEW GRAM POSITIVE COCCI IN PAIRS IN CLUSTERS RARE GRAM NEGATIVE RODS    Culture   Final    ABUNDANT Normal respiratory flora-no Staph aureus or Pseudomonas seen Performed at [facility], [address] [place], [place] [address]    Report Status 02/09/2020 FINAL  Final     Time coordinating discharge: Over 30 minutes  SIGNED:   Charise KillianJamiese M Sasan Wilkie, MD  [facility] 02/12/2020, 2:38 PM Pager   If 7PM-7AM, please contact night-coverage

## 2020-02-12 NOTE — TOC Initial Note (Signed)
Transition of Care [facility]) - Initial/Assessment Note    Patient Details  Name: Jason Cowan MRN: [id number] Date of Birth: 01/06/1971  Transition of Care [facility]) CM/SW Contact:    Maree Krabbe, LCSW Phone Number: 02/12/2020, 1:53 PM  Clinical Narrative:  Pt Covid positive. Pt declines HH stating once he gets moving he wont need it. 02 delivered via [employer] at bedside. [facility] set up. RN aware.                 Expected Discharge Plan: Home/Self Care Barriers to Discharge: No Barriers Identified   Patient Goals and CMS Choice        Expected Discharge Plan and Services Expected Discharge Plan: Home/Self Care     Post Acute Care Choice: NA Living arrangements for the past 2 months: Single Family Home Expected Discharge Date: 02/12/20               DME Arranged: Oxygen DME Agency: [employer] Date DME Agency Contacted: 02/12/20 Time DME Agency Contacted: [id number] Representative spoke with at DME Agency: [email or web address]            Prior Living Arrangements/Services Living arrangements for the past 2 months: Single Family Home Lives with:: Significant Other Patient language and need for interpreter reviewed:: Yes Do you feel safe going back to the place where you live?: Yes      Need for Family Participation in Patient Care: Yes (Comment) Care giver support system in place?: Yes (comment)   Criminal Activity/Legal Involvement Pertinent to Current Situation/Hospitalization: No - Comment as needed  Activities of Daily Living Home Assistive Devices/Equipment: None ADL Screening (condition at time of admission) Patient's cognitive ability adequate to safely complete daily activities?: Yes Is the patient deaf or have difficulty hearing?: No Does the patient have difficulty seeing, even when wearing glasses/contacts?: No Does the patient have difficulty concentrating, remembering, or making decisions?: No Patient able to express need for assistance with ADLs?: Yes Does the  patient have difficulty dressing or bathing?: No Independently performs ADLs?: Yes (appropriate for developmental age) Does the patient have difficulty walking or climbing stairs?: No Weakness of Legs: None Weakness of Arms/Hands: None  Permission Sought/Granted Permission sought to share information with : Family Supports    Share Information with NAME: tina     Permission granted to share info w Relationship: significant other     Emotional Assessment Appearance:: Appears stated age Attitude/Demeanor/Rapport: Engaged Affect (typically observed): Accepting,Appropriate Orientation: : Oriented to Situation,Oriented to  Time,Oriented to Self,Oriented to Place Alcohol / Substance Use: Not Applicable Psych Involvement: No (comment)  Admission diagnosis:  Acute hypoxemic respiratory failure due to COVID-19 (HCC) [U07.1, J96.01] Pneumonia due to COVID-19 virus [U07.1, J12.82] Patient Active Problem List   Diagnosis Date Noted  . Pneumonia due to COVID-19 virus 02/01/2020  . Acute hypoxemic respiratory failure due to COVID-19 (HCC) 02/01/2020  . HTN (hypertension) 02/01/2020   PCP:  Patient, No Pcp Per Pharmacy:   [id number] [place], [place] - [address] [place] [address] Phone: [phone] Fax: [phone]     Social Determinants of Health (SDOH) Interventions    Readmission Risk Interventions No flowsheet data found.

## 2020-02-12 NOTE — Plan of Care (Signed)
Pt educated on home oxygen Discharge orders reviewed with patient Verbalizes an understanding on plan of care Denies any additional wants or needs at time   Problem: Education: Goal: Knowledge of General Education information will improve Description: Including pain rating scale, medication(s)/side effects and non-pharmacologic comfort measures Outcome: Adequate for Discharge   Problem: Health Behavior/Discharge Planning: Goal: Ability to manage health-related needs will improve Outcome: Adequate for Discharge   Problem: Clinical Measurements: Goal: Ability to maintain clinical measurements within normal limits will improve Outcome: Adequate for Discharge Goal: Will remain free from infection Outcome: Adequate for Discharge Goal: Diagnostic test results will improve Outcome: Adequate for Discharge Goal: Respiratory complications will improve Outcome: Adequate for Discharge Goal: Cardiovascular complication will be avoided Outcome: Adequate for Discharge   Problem: Activity: Goal: Risk for activity intolerance will decrease Outcome: Adequate for Discharge   Problem: Nutrition: Goal: Adequate nutrition will be maintained Outcome: Adequate for Discharge   Problem: Coping: Goal: Level of anxiety will decrease Outcome: Adequate for Discharge   Problem: Elimination: Goal: Will not experience complications related to bowel motility Outcome: Adequate for Discharge Goal: Will not experience complications related to urinary retention Outcome: Adequate for Discharge   Problem: Pain Managment: Goal: General experience of comfort will improve Outcome: Adequate for Discharge   Problem: Safety: Goal: Ability to remain free from injury will improve Outcome: Adequate for Discharge   Problem: Skin Integrity: Goal: Risk for impaired skin integrity will decrease Outcome: Adequate for Discharge   Problem: Education: Goal: Knowledge of risk factors and measures for prevention of  condition will improve Outcome: Adequate for Discharge   Problem: Coping: Goal: Psychosocial and spiritual needs will be supported Outcome: Adequate for Discharge   Problem: Respiratory: Goal: Will maintain a patent airway Outcome: Adequate for Discharge Goal: Complications related to the disease process, condition or treatment will be avoided or minimized Outcome: Adequate for Discharge

## 2020-02-12 NOTE — TOC Progression Note (Signed)
Transition of Care [facility]) - Progression Note    Patient Details  Name: Jason Cowan MRN: [id number] Date of Birth: 1970-04-05  Transition of Care [facility]) CM/SW Contact  Maree Krabbe, LCSW Phone Number: 02/12/2020, 10:08 AM  Clinical Narrative:   Referral given to [employer] for 02. Requested ASAP. [employer] will deliver at bedside.         Expected Discharge Plan and Services                                                 Social Determinants of Health (SDOH) Interventions    Readmission Risk Interventions No flowsheet data found.

## 2020-02-13 DIAGNOSIS — E119 Type 2 diabetes mellitus without complications: Secondary | ICD-10-CM | POA: Diagnosis present

## 2020-02-13 DIAGNOSIS — Z79899 Other long term (current) drug therapy: Secondary | ICD-10-CM

## 2020-02-13 DIAGNOSIS — F17290 Nicotine dependence, other tobacco product, uncomplicated: Secondary | ICD-10-CM | POA: Diagnosis present

## 2020-02-13 DIAGNOSIS — F419 Anxiety disorder, unspecified: Secondary | ICD-10-CM | POA: Diagnosis present

## 2020-02-13 DIAGNOSIS — Z7984 Long term (current) use of oral hypoglycemic drugs: Secondary | ICD-10-CM

## 2020-02-13 DIAGNOSIS — J1282 Pneumonia due to coronavirus disease 2019: Secondary | ICD-10-CM | POA: Diagnosis present

## 2020-02-13 DIAGNOSIS — Z8701 Personal history of pneumonia (recurrent): Secondary | ICD-10-CM

## 2020-02-13 DIAGNOSIS — I10 Essential (primary) hypertension: Secondary | ICD-10-CM | POA: Diagnosis present

## 2020-02-13 DIAGNOSIS — Z7952 Long term (current) use of systemic steroids: Secondary | ICD-10-CM

## 2020-02-13 DIAGNOSIS — U071 COVID-19: Principal | ICD-10-CM | POA: Diagnosis present

## 2020-02-13 DIAGNOSIS — J9621 Acute and chronic respiratory failure with hypoxia: Secondary | ICD-10-CM | POA: Diagnosis present

## 2020-02-13 DIAGNOSIS — I2699 Other pulmonary embolism without acute cor pulmonale: Secondary | ICD-10-CM | POA: Diagnosis present

## 2020-02-13 DIAGNOSIS — Z8616 Personal history of COVID-19: Secondary | ICD-10-CM

## 2020-02-13 DIAGNOSIS — J982 Interstitial emphysema: Secondary | ICD-10-CM | POA: Diagnosis present

## 2020-02-14 ENCOUNTER — Emergency Department: Payer: BC Managed Care – PPO

## 2020-02-14 ENCOUNTER — Encounter: Payer: Self-pay | Admitting: Emergency Medicine

## 2020-02-14 ENCOUNTER — Inpatient Hospital Stay
Admit: 2020-02-14 | Discharge: 2020-02-14 | Disposition: A | Discharge status: Home / Self Care | Payer: BC Managed Care – PPO | Attending: Internal Medicine | Admitting: Internal Medicine

## 2020-02-14 ENCOUNTER — Inpatient Hospital Stay
Admission: EM | Admit: 2020-02-14 | Discharge: 2020-02-21 | DRG: 177 | Disposition: A | Discharge status: Home / Self Care | Payer: BC Managed Care – PPO | Attending: Internal Medicine | Admitting: Internal Medicine

## 2020-02-14 ENCOUNTER — Other Ambulatory Visit: Payer: Self-pay

## 2020-02-14 DIAGNOSIS — I2699 Other pulmonary embolism without acute cor pulmonale: Secondary | ICD-10-CM | POA: Diagnosis present

## 2020-02-14 DIAGNOSIS — U071 COVID-19: Secondary | ICD-10-CM | POA: Diagnosis present

## 2020-02-14 DIAGNOSIS — Z8616 Personal history of COVID-19: Secondary | ICD-10-CM | POA: Diagnosis not present

## 2020-02-14 DIAGNOSIS — I10 Essential (primary) hypertension: Secondary | ICD-10-CM | POA: Diagnosis present

## 2020-02-14 DIAGNOSIS — J1282 Pneumonia due to coronavirus disease 2019: Secondary | ICD-10-CM | POA: Diagnosis present

## 2020-02-14 DIAGNOSIS — J9621 Acute and chronic respiratory failure with hypoxia: Secondary | ICD-10-CM | POA: Diagnosis present

## 2020-02-14 DIAGNOSIS — J982 Interstitial emphysema: Secondary | ICD-10-CM | POA: Diagnosis present

## 2020-02-14 DIAGNOSIS — Z7952 Long term (current) use of systemic steroids: Secondary | ICD-10-CM | POA: Diagnosis not present

## 2020-02-14 DIAGNOSIS — Z79899 Other long term (current) drug therapy: Secondary | ICD-10-CM | POA: Diagnosis not present

## 2020-02-14 DIAGNOSIS — E119 Type 2 diabetes mellitus without complications: Secondary | ICD-10-CM

## 2020-02-14 DIAGNOSIS — Z8701 Personal history of pneumonia (recurrent): Secondary | ICD-10-CM | POA: Diagnosis not present

## 2020-02-14 DIAGNOSIS — J9600 Acute respiratory failure, unspecified whether with hypoxia or hypercapnia: Secondary | ICD-10-CM

## 2020-02-14 DIAGNOSIS — J9601 Acute respiratory failure with hypoxia: Secondary | ICD-10-CM

## 2020-02-14 DIAGNOSIS — R0602 Shortness of breath: Secondary | ICD-10-CM | POA: Diagnosis not present

## 2020-02-14 DIAGNOSIS — F419 Anxiety disorder, unspecified: Secondary | ICD-10-CM | POA: Diagnosis present

## 2020-02-14 DIAGNOSIS — J441 Chronic obstructive pulmonary disease with (acute) exacerbation: Secondary | ICD-10-CM

## 2020-02-14 DIAGNOSIS — F17290 Nicotine dependence, other tobacco product, uncomplicated: Secondary | ICD-10-CM | POA: Diagnosis present

## 2020-02-14 DIAGNOSIS — Z7984 Long term (current) use of oral hypoglycemic drugs: Secondary | ICD-10-CM | POA: Diagnosis not present

## 2020-02-14 LAB — CBC
HCT: 46 % (ref 39.0–52.0)
Hemoglobin: 14.9 g/dL (ref 13.0–17.0)
MCH: 30.5 pg (ref 26.0–34.0)
MCHC: 32.4 g/dL (ref 30.0–36.0)
MCV: 94.3 fL (ref 80.0–100.0)
Platelets: 235 10*3/uL (ref 150–400)
RBC: 4.88 MIL/uL (ref 4.22–5.81)
RDW: 14.4 % (ref 11.5–15.5)
WBC: 12 10*3/uL — ABNORMAL HIGH (ref 4.0–10.5)
nRBC: 0 % (ref 0.0–0.2)

## 2020-02-14 LAB — COMPREHENSIVE METABOLIC PANEL
ALT: 39 U/L (ref 0–44)
AST: 19 U/L (ref 15–41)
Albumin: 3.4 g/dL — ABNORMAL LOW (ref 3.5–5.0)
Alkaline Phosphatase: 52 U/L (ref 38–126)
Anion gap: 10 (ref 5–15)
BUN: 25 mg/dL — ABNORMAL HIGH (ref 6–20)
CO2: 29 mmol/L (ref 22–32)
Calcium: 8.7 mg/dL — ABNORMAL LOW (ref 8.9–10.3)
Chloride: 102 mmol/L (ref 98–111)
Creatinine, Ser: 1.32 mg/dL — ABNORMAL HIGH (ref 0.61–1.24)
GFR, Estimated: >60 mL/min (ref >60)
Glucose, Bld: 142 mg/dL — ABNORMAL HIGH (ref 70–99)
Potassium: 4.5 mmol/L (ref 3.5–5.1)
Sodium: 141 mmol/L (ref 135–145)
Total Bilirubin: 0.8 mg/dL (ref 0.3–1.2)
Total Protein: 6.3 g/dL — ABNORMAL LOW (ref 6.5–8.1)

## 2020-02-14 LAB — ECHOCARDIOGRAM COMPLETE
AR max vel: 2.59 cm2
AV Area VTI: 2.66 cm2
AV Area mean vel: 2.47 cm2
AV Mean grad: 2 mmHg
AV Peak grad: 2.4 mmHg
Ao pk vel: 0.78 m/s
Area-P 1/2: 3.74 cm2
Height: 66 in
S' Lateral: 2.3 cm
Weight: 2821.89 oz

## 2020-02-14 LAB — CBG MONITORING, ED
Glucose-Capillary: 122 mg/dL — ABNORMAL HIGH (ref 70–99)
Glucose-Capillary: 123 mg/dL — ABNORMAL HIGH (ref 70–99)
Glucose-Capillary: 125 mg/dL — ABNORMAL HIGH (ref 70–99)

## 2020-02-14 LAB — PROCALCITONIN: Procalcitonin: <0.1 ng/mL

## 2020-02-14 LAB — TROPONIN I (HIGH SENSITIVITY): Troponin I (High Sensitivity): 8 ng/L (ref <18)

## 2020-02-14 MED ORDER — ADULT MULTIVITAMIN W/MINERALS CH
1.0000 | ORAL_TABLET | Freq: Every day | ORAL | Status: DC
  Administered 2020-02-14 – 2020-02-21 (×8): 1 via ORAL
  Filled 2020-02-14 (×8): qty 1

## 2020-02-14 MED ORDER — ACETAMINOPHEN 325 MG PO TABS: 650.0000 mg | ORAL_TABLET | Freq: Four times a day (QID) | ORAL | Status: DC | PRN

## 2020-02-14 MED ORDER — INSULIN ASPART 100 UNIT/ML ~~LOC~~ SOLN
0.0000 [IU] | Freq: Three times a day (TID) | SUBCUTANEOUS | Status: DC
  Administered 2020-02-14 – 2020-02-15 (×3): 2 [IU] via SUBCUTANEOUS
  Administered 2020-02-15: 3 [IU] via SUBCUTANEOUS
  Administered 2020-02-16: 2 [IU] via SUBCUTANEOUS
  Administered 2020-02-18 – 2020-02-19 (×2): 3 [IU] via SUBCUTANEOUS
  Filled 2020-02-14 (×6): qty 1

## 2020-02-14 MED ORDER — ONDANSETRON HCL 4 MG/2ML IJ SOLN: 4.0000 mg | Freq: Four times a day (QID) | INTRAMUSCULAR | Status: DC | PRN

## 2020-02-14 MED ORDER — IOHEXOL 350 MG/ML SOLN
75.0000 mL | Freq: Once | INTRAVENOUS | Status: AC | PRN
  Administered 2020-02-14: 75 mL via INTRAVENOUS
  Filled 2020-02-14: qty 75

## 2020-02-14 MED ORDER — ONDANSETRON HCL 4 MG PO TABS: 4.0000 mg | ORAL_TABLET | Freq: Four times a day (QID) | ORAL | Status: DC | PRN

## 2020-02-14 MED ORDER — ENOXAPARIN SODIUM 80 MG/0.8ML ~~LOC~~ SOLN
1.0000 mg/kg | Freq: Two times a day (BID) | SUBCUTANEOUS | Status: DC
  Administered 2020-02-15 (×2): 80 mg via SUBCUTANEOUS
  Filled 2020-02-14 (×6): qty 0.8

## 2020-02-14 MED ORDER — SODIUM CHLORIDE 0.9% FLUSH: 3.0000 mL | INTRAVENOUS | Status: DC | PRN

## 2020-02-14 MED ORDER — ACETAMINOPHEN 650 MG RE SUPP: 650.0000 mg | Freq: Four times a day (QID) | RECTAL | Status: DC | PRN

## 2020-02-14 MED ORDER — ENOXAPARIN SODIUM 80 MG/0.8ML ~~LOC~~ SOLN
1.0000 mg/kg | Freq: Once | SUBCUTANEOUS | Status: AC
  Administered 2020-02-14 (×2): 80 mg via SUBCUTANEOUS
  Filled 2020-02-14: qty 0.8

## 2020-02-14 MED ORDER — SODIUM CHLORIDE 0.9 % IV SOLN: 250.0000 mL | INTRAVENOUS | Status: DC | PRN

## 2020-02-14 MED ORDER — PREDNISONE 20 MG PO TABS
40.0000 mg | ORAL_TABLET | Freq: Every day | ORAL | Status: DC
  Administered 2020-02-14 – 2020-02-15 (×2): 40 mg via ORAL
  Filled 2020-02-14 (×2): qty 2

## 2020-02-14 MED ORDER — SODIUM CHLORIDE 0.9% FLUSH
3.0000 mL | Freq: Two times a day (BID) | INTRAVENOUS | Status: DC
  Administered 2020-02-14 – 2020-02-21 (×13): 3 mL via INTRAVENOUS

## 2020-02-14 NOTE — ED Provider Notes (Signed)
[facility] Emergency Department Provider Note   ____________________________________________   Event Date/Time   First MD Initiated Contact with Patient 02/14/20 0901     (approximate)  I have reviewed the triage vital signs and the nursing notes.   HISTORY  Chief Complaint Shortness of Breath    HPI Jason Cowan is a 50 y.o. male stated past medical history of hypertension who presents for worsening shortness of breath in the setting of COVID diagnosis on 01/26/2020.  Patient was admitted to the hospital and just discharged yesterday on 2 L nasal cannula through an oxygen concentrator at home.  Patient states that his shortness of breath has been worsening since discharge and is now requiring 4 L by nasal cannula with significant dyspnea on exertion including even transferring from a wheelchair to a chair in triage.  Patient currently denies any vision changes, tinnitus, difficulty speaking, facial droop, sore throat, chest pain, abdominal pain, nausea/vomiting/diarrhea, dysuria, or weakness/numbness/paresthesias in any extremity         Past Medical History:  Diagnosis Date  . Hypertension     Patient Active Problem List   Diagnosis Date Noted  . Acute pulmonary embolism (HCC) 02/14/2020  . Type 2 diabetes mellitus without complication (HCC) 02/14/2020  . Acute respiratory failure (HCC) 02/14/2020  . Pneumonia due to COVID-19 virus 02/01/2020  . Acute hypoxemic respiratory failure due to COVID-19 (HCC) 02/01/2020  . HTN (hypertension) 02/01/2020    History reviewed. No pertinent surgical history.  Prior to Admission medications   Medication Sig Start Date End Date Taking? Authorizing Provider  Multiple Vitamin (MULTIVITAMIN WITH MINERALS) TABS tablet Take 1 tablet by mouth daily.   Yes [provider]  metFORMIN (GLUCOPHAGE) 500 MG tablet Take 1 tablet (500 mg total) by mouth 2 (two) times daily with a meal. 02/12/20 03/13/20   Charise KillianWilliams, Jamiese M, MD  predniSONE (DELTASONE) 20 MG tablet Take 2 tablets (40 mg total) by mouth daily for 4 days. 02/12/20 02/16/20  Charise KillianWilliams, Jamiese M, MD    Allergies Patient has no known allergies.  History reviewed. No pertinent family history.  Social History Social History   Tobacco Use  . Smoking status: Current Every Day Smoker    Types: Cigars  . Smokeless tobacco: Never Used  Substance Use Topics  . Alcohol use: Yes  . Drug use: Not Currently    Review of Systems Constitutional: No fever/chills Eyes: No visual changes. ENT: No sore throat. Cardiovascular: Denies chest pain. Respiratory: Endorses shortness of breath. Gastrointestinal: No abdominal pain.  No nausea, no vomiting.  No diarrhea. Genitourinary: Negative for dysuria. Musculoskeletal: Negative for acute arthralgias Skin: Negative for rash. Neurological: Negative for headaches, weakness/numbness/paresthesias in any extremity Psychiatric: Negative for suicidal ideation/homicidal ideation   ____________________________________________   PHYSICAL EXAM:  VITAL SIGNS: ED Triage Vitals  Enc Vitals Group     BP 02/14/20 0043 (!) 128/91     Pulse Rate 02/14/20 0043 97     Resp 02/14/20 0043 20     Temp 02/14/20 0043 98.9 F (37.2 C)     Temp Source 02/14/20 0043 Oral     SpO2 02/14/20 0002 94 %     Weight 02/14/20 0044 176 lb 5.9 oz (80 kg)     Height 02/14/20 0044 5\' 6"  (1.676 m)     Head Circumference --      Peak Flow --      Pain Score 02/14/20 0043 0     Pain Loc --  Pain Edu? --      Excl. in GC? --    Constitutional: Alert and oriented. Well appearing and in no acute distress. Eyes: Conjunctivae are normal. PERRL. Head: Atraumatic. Nose: No congestion/rhinnorhea. Mouth/Throat: Mucous membranes are moist. Neck: No stridor Cardiovascular: Grossly normal heart sounds.  Good peripheral circulation. Respiratory: Increased respiratory effort.  Tachypnea.  4 L nasal  cannula Gastrointestinal: Soft and nontender. No distention. Musculoskeletal: No obvious deformities Neurologic:  Normal speech and language. No gross focal neurologic deficits are appreciated. Skin:  Skin is warm and dry. No rash noted. Psychiatric: Mood and affect are normal. Speech and behavior are normal.  ____________________________________________   LABS (all labs ordered are listed, but only abnormal results are displayed)  Labs Reviewed  CBC - Abnormal; Notable for the following components:      Result Value   WBC 12.0 (*)    All other components within normal limits  COMPREHENSIVE METABOLIC PANEL - Abnormal; Notable for the following components:   Glucose, Bld 142 (*)    BUN 25 (*)    Creatinine, Ser 1.32 (*)    Calcium 8.7 (*)    Total Protein 6.3 (*)    Albumin 3.4 (*)    All other components within normal limits  CBC - Abnormal; Notable for the following components:   WBC 11.5 (*)    All other components within normal limits  BASIC METABOLIC PANEL - Abnormal; Notable for the following components:   Glucose, Bld 116 (*)    BUN 22 (*)    Calcium 8.7 (*)    All other components within normal limits  CBG MONITORING, ED - Abnormal; Notable for the following components:   Glucose-Capillary 123 (*)    All other components within normal limits  CBG MONITORING, ED - Abnormal; Notable for the following components:   Glucose-Capillary 125 (*)    All other components within normal limits  CBG MONITORING, ED - Abnormal; Notable for the following components:   Glucose-Capillary 122 (*)    All other components within normal limits  CBG MONITORING, ED - Abnormal; Notable for the following components:   Glucose-Capillary 106 (*)    All other components within normal limits  PROCALCITONIN  PROCALCITONIN  TROPONIN I (HIGH SENSITIVITY)   ____________________________________________  EKG  ED ECG REPORT I, Merwyn Katos, the attending physician, personally viewed and  interpreted this ECG.  Date: 02/14/2020 EKG Time: 0048 Rate: 99 Rhythm: normal sinus rhythm QRS Axis: normal Intervals: normal ST/T Wave abnormalities: normal Narrative Interpretation: no evidence of acute ischemia  ____________________________________________  RADIOLOGY  ED MD interpretation: 2 view x-ray of the chest shows diffuse bilateral hazy airspace opacities concerning for multifocal pneumonia.  No evidence of pneumothorax or widened mediastinum  Official radiology report(s): CT Angio Chest PE W/Cm &/Or Wo Cm  Result Date: 02/14/2020 CLINICAL DATA:  Increased shortness of breath with exertion, discharge with COVID pneumonia yesterday EXAM: CT ANGIOGRAPHY CHEST WITH CONTRAST TECHNIQUE: Multidetector CT imaging of the chest was performed using the standard protocol during bolus administration of intravenous contrast. Multiplanar CT image reconstructions and MIPs were obtained to evaluate the vascular anatomy. CONTRAST:  34mL OMNIPAQUE IOHEXOL 350 MG/ML SOLN COMPARISON:  02/03/2020 FINDINGS: Cardiovascular: Satisfactory opacification of the pulmonary arteries to the level of lobar and some segmental branches. There is motion artifact particularly at the bases. Filling defects are present within right lower lobe segmental arteries. No definite additional filling defects identified. Normal heart size. No pericardial effusion. Mediastinum/Nodes: Small volume pneumomediastinum. Likely  reactive prominent hilar nodes. Thyroid and esophagus are unremarkable. Lungs/Pleura: Persistent bilateral ground-glass and consolidative opacities. Lung aeration has improved compared to the prior study. No pleural effusion. No pneumothorax. Upper Abdomen: No acute abnormality. Musculoskeletal: No acute osseous abnormality. Review of the MIP images confirms the above findings. IMPRESSION: Acute right lower lobe segmental pulmonary emboli. Persistent but improved COVID pneumonia. New small volume pneumomediastinum.  These results were called by telephone at the time of interpretation on 02/14/2020 at 10:39 am to provider [facility] Dameion Briles , who verbally acknowledged these results. Electronically Signed   By: Guadlupe SpanishPraneil  Patel M.D.   On: 02/14/2020 10:39   ECHOCARDIOGRAM COMPLETE  Result Date: 02/14/2020    ECHOCARDIOGRAM REPORT   Patient Name:   Rutherford [facility]HENRY [facility]AH [facility] Date of Exam: 02/14/2020 Medical Rec #:  [id number]           Height:       66.0 in Accession #:    [id number]          Weight:       176.4 lb Date of Birth:  [birth year]           BSA:          1.896 m Patient Age:    49 years            BP:           139/106 mmHg Patient Gender: M                   HR:           89 bpm. Exam Location:  [facility] Procedure: 2D Echo, Cardiac Doppler and Color Doppler Indications:     Pulmonary embolus 415.19 / I26.99  History:         Patient has no prior history of Echocardiogram examinations.                  Risk Factors:Hypertension.  Sonographer:     Cristela BlueJerry Hege RDCS (AE) Referring Phys:  [id number] Lucile ShuttersCHUKWU AGBATA Diagnosing Phys: Arnoldo HookerBruce Kowalski MD  Sonographer Comments: Suboptimal apical window. IMPRESSIONS  1. Left ventricular ejection fraction, by estimation, is 60 to 65%. The left ventricle has normal function. The left ventricle has no regional wall motion abnormalities. Left ventricular diastolic parameters were normal.  2. Right ventricular systolic function is normal. The right ventricular size is normal.  3. The mitral valve is normal in structure. Trivial mitral valve regurgitation.  4. The aortic valve is normal in structure. Aortic valve regurgitation is not visualized. FINDINGS  Left Ventricle: Left ventricular ejection fraction, by estimation, is 60 to 65%. The left ventricle has normal function. The left ventricle has no regional wall motion abnormalities. The left ventricular internal cavity size was normal in size. There is  no left ventricular hypertrophy. Left ventricular diastolic parameters were normal. Right Ventricle: The  right ventricular size is normal. No increase in right ventricular wall thickness. Right ventricular systolic function is normal. Left Atrium: Left atrial size was normal in size. Right Atrium: Right atrial size was normal in size. Pericardium: There is no evidence of pericardial effusion. Mitral Valve: The mitral valve is normal in structure. Trivial mitral valve regurgitation. Tricuspid Valve: The tricuspid valve is normal in structure. Tricuspid valve regurgitation is trivial. Aortic Valve: The aortic valve is normal in structure. Aortic valve regurgitation is not visualized. Aortic valve mean gradient measures 2.0 mmHg. Aortic valve peak gradient measures 2.4 mmHg. Aortic valve area, by VTI measures 2.66 cm. Pulmonic  Valve: The pulmonic valve was normal in structure. Pulmonic valve regurgitation is not visualized. Aorta: The aortic root and ascending aorta are structurally normal, with no evidence of dilitation. IAS/Shunts: No atrial level shunt detected by color flow Doppler.  LEFT VENTRICLE PLAX 2D LVIDd:         3.28 cm  Diastology LVIDs:         2.30 cm  LV e' medial:    5.55 cm/s LV PW:         1.17 cm  LV E/e' medial:  9.9 LV IVS:        0.92 cm  LV e' lateral:   8.81 cm/s LVOT diam:     2.00 cm  LV E/e' lateral: 6.2 LV SV:         36 LV SV Index:   19 LVOT Area:     3.14 cm  RIGHT VENTRICLE RV Basal diam:  2.65 cm RV S prime:     13.40 cm/s TAPSE (M-mode): 4.4 cm LEFT ATRIUM             Index       RIGHT ATRIUM           Index LA diam:        2.10 cm 1.11 cm/m  RA Area:     14.70 cm LA Vol (A2C):   33.6 ml 17.72 ml/m RA Volume:   38.20 ml  20.15 ml/m LA Vol (A4C):   38.5 ml 20.31 ml/m LA Biplane Vol: 38.3 ml 20.20 ml/m  AORTIC VALVE                   PULMONIC VALVE AV Area (Vmax):    2.59 cm    PV Vmax:        0.47 m/s AV Area (Vmean):   2.47 cm    PV Peak grad:   0.9 mmHg AV Area (VTI):     2.66 cm    RVOT Peak grad: 1 mmHg AV Vmax:           77.50 cm/s AV Vmean:          59.400 cm/s AV VTI:             0.136 m AV Peak Grad:      2.4 mmHg AV Mean Grad:      2.0 mmHg LVOT Vmax:         64.00 cm/s LVOT Vmean:        46.700 cm/s LVOT VTI:          0.115 m LVOT/AV VTI ratio: 0.85  AORTA Ao Root diam: 3.32 cm MITRAL VALVE               TRICUSPID VALVE MV Area (PHT): 3.74 cm    TR Peak grad:   7.1 mmHg MV Decel Time: 203 msec    TR Vmax:        133.00 cm/s MV E velocity: 54.80 cm/s MV A velocity: 54.40 cm/s  SHUNTS MV E/A ratio:  1.01        Systemic VTI:  0.12 m                            Systemic Diam: 2.00 cm Arnoldo Hooker MD Electronically signed by Arnoldo Hooker MD Signature Date/Time: 02/14/2020/6:17:40 PM    Final     ____________________________________________   PROCEDURES  Procedure(s) performed (including Critical Care):  Procedures  ____________________________________________   INITIAL IMPRESSION / ASSESSMENT AND PLAN / ED COURSE  As part of my medical decision making, I reviewed the following data within the electronic MEDICAL RECORD NUMBER Nursing notes reviewed and incorporated, Labs reviewed, EKG interpreted, Old chart reviewed, Radiograph reviewed and Notes from prior ED visits reviewed and incorporated        The patient appears to be suffering from a moderate exacerbation of COPD.  Based on the history, exam, CXR/EKG, and further workup I dont suspect any other emergent cause of this presentation, such as pneumonia, acute coronary syndrome, congestive heart failure, pulmonary embolism, or pneumothorax.  ED Interventions: bronchodilators, steroids, antibiotics, reassess  Dispo: Care of this patient will be signed out to the oncoming physician at the end of my shift.  All pertinent patient information conveyed and all questions answered.  All further care and disposition decisions will be made by the oncoming physician.      ____________________________________________   FINAL CLINICAL IMPRESSION(S) / ED DIAGNOSES  Final diagnoses:  COPD exacerbation (HCC)   SOB (shortness of breath)  Acute respiratory failure with hypoxia [facility])     ED Discharge Orders    None       Note:  This document was prepared using Dragon voice recognition software and may include unintentional dictation errors.   Merwyn Katos, MD 02/15/20 [phone]

## 2020-02-14 NOTE — ED Notes (Signed)
Lab advsied they would run the procalcitonn

## 2020-02-14 NOTE — ED Triage Notes (Signed)
EMS brings pt in from home; d/c last night from hosp for COVID; O2 at 4l/min via  all times; c/o [facility] on exertion

## 2020-02-14 NOTE — H&P (Signed)
History and Physical    Jason Cowan OTL:[id number] DOB: 10-03-1970 DOA: 02/14/2020  PCP: Patient, No Pcp Per   Patient coming from: Home  I have personally briefly reviewed patient's old medical records in [facility]  Chief Complaint: Shortness of breath  HPI: Jason Cowan is a 50 y.o. male with medical history significant for recent hospitalization for COVID-19 pneumonia (01/07 - 01/19) and acute respiratory failure and discharged home on 2 L of oxygen continuous, history of diabetes mellitus who presents to the emergency room via EMS within 24 hours of his discharge from the hospital for evaluation of worsening shortness of breath mainly with exertion.  Patient noted to have severe dyspnea just transferring from wheelchair into a chair which he states is new for him.  Patient had to increase his oxygen from 2 L to 4 L with some improvement in his symptoms. He has not had any chest pain, no nausea, no vomiting, no diaphoresis, no palpitations, no dizziness, no lightheadedness, no abdominal pain, no diarrhea, no constipation, no urinary frequency, no nocturia, no dysuria, no headache, no fever, no chills. Labs show sodium 141, potassium 4.5, chloride 102, bicarb 29, glucose 142, BUN 25, creatinine 1.32, calcium 8.7, alkaline phosphatase 52, albumin 3.4, AST 19, ALT 39, total protein 6.3, procalcitonin less than 0.10, white count 12.0, hemoglobin 14.9, hematocrit 46.0, MCV 94.3, RDW 14.5, platelets 235 CT angiogram of the chest shows an acute right lower lobe segmental pulmonary emboli.  Persistent but improved COVID-pneumonia.  New small volume pneumomediastinum. Twelve-lead EKG shows normal sinus rhythm with nonspecific ST and T wave abnormality.   ED Course: Patient is a 50 year old male who was recently hospitalized for COVID-pneumonia and acute respiratory failure and discharged home on 2 L of oxygen.  Patient presents to the emergency room via EMS within 24 hours of his  discharge for evaluation of worsening shortness of breath and is found to have an acute right lower lobe segmental pulmonary emboli with a small pneumomediastinum.  Emergency room physician discussed the case with CT surgery who recommends supportive care and symptom control with no further intervention unless the patient has worsening shortness of breath or further increase in his oxygen requirement.  Review of Systems: As per HPI otherwise all systems reviewed and negative.    Past Medical History:  Diagnosis Date  . Hypertension     History reviewed. No pertinent surgical history.   reports that he has been smoking cigars. He has never used smokeless tobacco. He reports current alcohol use. He reports previous drug use.  No Known Allergies  History reviewed. No pertinent family history.   Prior to Admission medications   Medication Sig Start Date End Date Taking? Authorizing Provider  metFORMIN (GLUCOPHAGE) 500 MG tablet Take 1 tablet (500 mg total) by mouth 2 (two) times daily with a meal. 02/12/20 03/13/20  Charise Killian, MD  Multiple Vitamin (MULTIVITAMIN WITH MINERALS) TABS tablet Take 1 tablet by mouth daily.    [provider]  predniSONE (DELTASONE) 20 MG tablet Take 2 tablets (40 mg total) by mouth daily for 4 days. 02/12/20 02/16/20  Charise Killian, MD    Physical Exam: Vitals:   02/14/20 0044 02/14/20 0446 02/14/20 0819 02/14/20 0849  BP:  117/82 118/80 (!) 139/106  Pulse:  93 87 89  Resp:  18 18 (!) 26  Temp:    97.6 F (36.4 C)  TempSrc:    Oral  SpO2:  99% 97% 96%  Weight: 80  kg     Height: 5\' 6"  (1.676 m)        Vitals:   02/14/20 0044 02/14/20 0446 02/14/20 0819 02/14/20 0849  BP:  117/82 118/80 (!) 139/106  Pulse:  93 87 89  Resp:  18 18 (!) 26  Temp:    97.6 F (36.4 C)  TempSrc:    Oral  SpO2:  99% 97% 96%  Weight: 80 kg     Height: 5\' 6"  (1.676 m)       Constitutional: NAD, alert and oriented x 3.  Acutely ill-appearing.  Has  conversational dyspnea Eyes: PERRL, lids and conjunctivae pallor ENMT: Mucous membranes are moist.  Neck: normal, supple, no masses, no thyromegaly Respiratory: Air movement in all lung fields, no wheezing, no crackles. Normal respiratory effort. No accessory muscle use.  Cardiovascular: Regular rate and rhythm, no murmurs / rubs / gallops. No extremity edema. 2+ pedal pulses. No carotid bruits.  Abdomen: no tenderness, no masses palpated. No hepatosplenomegaly. Bowel sounds positive.  Musculoskeletal: no clubbing / cyanosis. No joint deformity upper and lower extremities.  Skin: no rashes, lesions, ulcers.  Neurologic: No gross focal neurologic deficit. Psychiatric: Normal mood and affect.   Labs on Admission: I have personally reviewed following labs and imaging studies  CBC: Recent Labs  Lab 02/08/20 0617 02/09/20 0525 02/10/20 0414 02/11/20 0649 02/14/20 0119  WBC 17.7* 18.6* 19.1* 19.5* 12.0*  HGB 15.7 15.8 15.1 14.4 14.9  HCT 46.8 47.8 45.3 43.9 46.0  MCV 91.4 92.1 92.3 92.4 94.3  PLT 291 290 277 263 235   Basic Metabolic Panel: Recent Labs  Lab 02/08/20 0617 02/09/20 0525 02/10/20 0414 02/11/20 0649 02/14/20 0119  NA 135 133* 133* 133* 141  K 5.4* 5.4* 4.8 4.7 4.5  CL 101 102 101 103 102  CO2 25 21* 22 23 29   GLUCOSE 141* 167* 171* 176* 142*  BUN 28* 31* 35* 31* 25*  CREATININE 1.14 1.04 1.10 1.10 1.32*  CALCIUM 8.8* 9.0 8.9 8.5* 8.7*   GFR: Estimated Creatinine Clearance: 66.6 mL/min (A) (by C-G formula based on SCr of 1.32 mg/dL (H)). Liver Function Tests: Recent Labs  Lab 02/08/20 0617 02/09/20 0525 02/10/20 0414 02/11/20 0649 02/14/20 0119  AST 20 19 16 16 19   ALT 44 41 31 30 39  ALKPHOS 57 58 47 48 52  BILITOT 1.0 1.0 0.9 0.7 0.8  PROT 6.5 6.8 6.3* 6.2* 6.3*  ALBUMIN 3.1* 3.4* 3.2* 3.0* 3.4*   No results for input(s): LIPASE, AMYLASE in the last 168 hours. No results for input(s): AMMONIA in the last 168 hours. Coagulation Profile: No  results for input(s): INR, PROTIME in the last 168 hours. Cardiac Enzymes: No results for input(s): CKTOTAL, CKMB, CKMBINDEX, TROPONINI in the last 168 hours. BNP (last 3 results) No results for input(s): PROBNP in the last 8760 hours. HbA1C: No results for input(s): HGBA1C in the last 72 hours. CBG: Recent Labs  Lab 02/11/20 2045 02/12/20 0838 02/12/20 0909 02/12/20 1230 02/12/20 1700  GLUCAP 132* 173* 106* 162* 150*   Lipid Profile: No results for input(s): CHOL, HDL, LDLCALC, TRIG, CHOLHDL, LDLDIRECT in the last 72 hours. Thyroid Function Tests: No results for input(s): TSH, T4TOTAL, FREET4, T3FREE, THYROIDAB in the last 72 hours. Anemia Panel: No results for input(s): VITAMINB12, FOLATE, FERRITIN, TIBC, IRON, RETICCTPCT in the last 72 hours. Urine analysis: No results found for: COLORURINE, APPEARANCEUR, LABSPEC, PHURINE, GLUCOSEU, HGBUR, BILIRUBINUR, KETONESUR, PROTEINUR, UROBILINOGEN, NITRITE, LEUKOCYTESUR  Radiological Exams on Admission: DG Chest 2  View  Result Date: 02/14/2020 CLINICAL DATA:  Shortness of breath. EXAM: CHEST - 2 VIEW COMPARISON:  February 01, 2020 FINDINGS: There are diffuse bilateral hazy airspace opacities which appear to have worsened since the prior study. There is no pneumothorax. No large pleural effusion. The heart size is stable from prior study. There is no acute osseous abnormality. IMPRESSION: Diffuse bilateral hazy airspace opacities concerning for multifocal pneumonia (viral or bacterial). Electronically Signed   By: Katherine Mantle M.D.   On: 02/14/2020 01:28   CT Angio Chest PE W/Cm &/Or Wo Cm  Result Date: 02/14/2020 CLINICAL DATA:  Increased shortness of breath with exertion, discharge with COVID pneumonia yesterday EXAM: CT ANGIOGRAPHY CHEST WITH CONTRAST TECHNIQUE: Multidetector CT imaging of the chest was performed using the standard protocol during bolus administration of intravenous contrast. Multiplanar CT image reconstructions and  MIPs were obtained to evaluate the vascular anatomy. CONTRAST:  16mL OMNIPAQUE IOHEXOL 350 MG/ML SOLN COMPARISON:  02/03/2020 FINDINGS: Cardiovascular: Satisfactory opacification of the pulmonary arteries to the level of lobar and some segmental branches. There is motion artifact particularly at the bases. Filling defects are present within right lower lobe segmental arteries. No definite additional filling defects identified. Normal heart size. No pericardial effusion. Mediastinum/Nodes: Small volume pneumomediastinum. Likely reactive prominent hilar nodes. Thyroid and esophagus are unremarkable. Lungs/Pleura: Persistent bilateral ground-glass and consolidative opacities. Lung aeration has improved compared to the prior study. No pleural effusion. No pneumothorax. Upper Abdomen: No acute abnormality. Musculoskeletal: No acute osseous abnormality. Review of the MIP images confirms the above findings. IMPRESSION: Acute right lower lobe segmental pulmonary emboli. Persistent but improved COVID pneumonia. New small volume pneumomediastinum. These results were called by telephone at the time of interpretation on 02/14/2020 at 10:39 am to provider [facility] , who verbally acknowledged these results. Electronically Signed   By: Guadlupe Spanish M.D.   On: 02/14/2020 10:39    EKG: Independently reviewed.   Sinus rhythm Nonspecific ST and T wave abnormality  Assessment/Plan Principal Problem:   Acute pulmonary embolism (HCC) Active Problems:   Pneumonia due to COVID-19 virus   HTN (hypertension)   Type 2 diabetes mellitus without complication (HCC)   Acute respiratory failure (HCC)     Acute pulmonary embolism Most likely related to recent COVID-19 viral infection Place patient on Lovenox 1 mg/kg body weight subcu every 12 Obtain 2D echocardiogram    Worsening acute respiratory failure Patient was recently hospitalized for COVID-19 pneumonia and acute respiratory failure and was discharged home on  2 L of oxygen He currently has an increased oxygen requirement most likely secondary to acute PE and is on 4 L of oxygen via nasal cannula Continue oxygen supplementation to maintain pulse oximetry greater than 92%    COVID-19 pneumonia Complete course of prednisone the patient was discharged home on    Diabetes mellitus Maintain consistent carbohydrate diet Sliding scale insulin for glycemic control   Pneumomediastinum Incidental finding on CT angiogram of the chest Discussed with CT surgery, Dr Renaldo Fiddler at [facility] who recommended supportive care and to repeat CT scan of the chest in 1 - 2 days   DVT prophylaxis: Lovenox Code Status: Full code Family Communication: Greater than 50% of time was spent discussing plan of care with patient at the bedside. All questions and concerns have been addressed. He verbalizes understanding and agrees with the plan of care Disposition Plan: Back to previous home environment Consults called: CT surgery (Phone consult)    Lucile Shutters MD [facility]  02/14/2020, 11:35 AM

## 2020-02-14 NOTE — Progress Notes (Signed)
*  PRELIMINARY RESULTS* Echocardiogram 2D Echocardiogram has been performed.  Cristela Blue 02/14/2020, 12:47 PM

## 2020-02-14 NOTE — ED Triage Notes (Signed)
Pt to ED via EMS from home c/o SOB.  States dx COVID on the 1st, was discharged yesterday after admission to hospital here.  Pt states was discharged on 2L [place] which was new.  Pt presents on 4L [place] and states severe dyspnea even transferring from a wheelchair to chair.  Pt A&Ox4, skin WNL, chest rise even and unlabored.

## 2020-02-15 ENCOUNTER — Encounter: Payer: Self-pay | Admitting: Internal Medicine

## 2020-02-15 DIAGNOSIS — J982 Interstitial emphysema: Secondary | ICD-10-CM

## 2020-02-15 DIAGNOSIS — J9621 Acute and chronic respiratory failure with hypoxia: Secondary | ICD-10-CM

## 2020-02-15 LAB — CBC
HCT: 42.4 % (ref 39.0–52.0)
Hemoglobin: 14.2 g/dL (ref 13.0–17.0)
MCH: 31.1 pg (ref 26.0–34.0)
MCHC: 33.5 g/dL (ref 30.0–36.0)
MCV: 92.8 fL (ref 80.0–100.0)
Platelets: 239 10*3/uL (ref 150–400)
RBC: 4.57 MIL/uL (ref 4.22–5.81)
RDW: 14.8 % (ref 11.5–15.5)
WBC: 11.5 10*3/uL — ABNORMAL HIGH (ref 4.0–10.5)
nRBC: 0 % (ref 0.0–0.2)

## 2020-02-15 LAB — BASIC METABOLIC PANEL
Anion gap: 10 (ref 5–15)
BUN: 22 mg/dL — ABNORMAL HIGH (ref 6–20)
CO2: 26 mmol/L (ref 22–32)
Calcium: 8.7 mg/dL — ABNORMAL LOW (ref 8.9–10.3)
Chloride: 100 mmol/L (ref 98–111)
Creatinine, Ser: 0.97 mg/dL (ref 0.61–1.24)
GFR, Estimated: >60 mL/min (ref >60)
Glucose, Bld: 116 mg/dL — ABNORMAL HIGH (ref 70–99)
Potassium: 5 mmol/L (ref 3.5–5.1)
Sodium: 136 mmol/L (ref 135–145)

## 2020-02-15 LAB — CBG MONITORING, ED
Glucose-Capillary: 106 mg/dL — ABNORMAL HIGH (ref 70–99)
Glucose-Capillary: 127 mg/dL — ABNORMAL HIGH (ref 70–99)
Glucose-Capillary: 197 mg/dL — ABNORMAL HIGH (ref 70–99)
Glucose-Capillary: 134 mg/dL — ABNORMAL HIGH (ref 70–99)

## 2020-02-15 LAB — PROCALCITONIN: Procalcitonin: <0.1 ng/mL

## 2020-02-15 NOTE — Progress Notes (Addendum)
Progress Note    Jason Cowan  MBT:[id number] DOB: Jun 21, 1970  DOA: 02/14/2020 PCP: Patient, No Pcp Per      Brief Narrative:    Medical records reviewed and are as summarized below:  Jason Cowan is a 50 y.o. male       Assessment/Plan:   Principal Problem:   Acute pulmonary embolism (HCC) Active Problems:   Pneumonia due to COVID-19 virus   HTN (hypertension)   Type 2 diabetes mellitus without complication (HCC)   Acute on chronic respiratory failure with hypoxia (HCC)   Pneumomediastinum (HCC)   Body mass index is 28.47 kg/m.    Continue full dose Lovenox for acute pulmonary embolism.  Plan to transition to Eliquis tomorrow.  Discussed risks, benefits and different types of anticoagulants.  He agrees to proceed with treatment with Eliquis.  He is on 4 L/min oxygen via nasal collar.  He was recently discharged home on 2 L/min oxygen.  Taper down oxygen to 2 L/min if able  He recently completed a course of steroids for COVID-19 pneumonia.  Discontinue prednisone.  Repeat CT chest tomorrow per recommendation of CT surgeon for further evaluation of pneumomediastinum.    Diet Order            Diet Carb Modified Fluid consistency: Thin; Room service appropriate? Yes  Diet effective now                    Consultants:  None  Procedures:  None    Medications:   . enoxaparin (LOVENOX) injection  1 mg/kg Subcutaneous Q12H  . insulin aspart  0-15 Units Subcutaneous TID WC  . multivitamin with minerals  1 tablet Oral Daily  . sodium chloride flush  3 mL Intravenous Q12H   Continuous Infusions: . sodium chloride       Anti-infectives (From admission, onward)   None             Family Communication/Anticipated D/C date and plan/Code Status   DVT prophylaxis:      Code Status: Full Code  Family Communication: None Disposition Plan:    Status is: Inpatient  Remains inpatient appropriate because:Inpatient  level of care appropriate due to severity of illness   Dispo: The patient is from: Home              Anticipated d/c is to: Home              Anticipated d/c date is: 1 day              Patient currently is not medically stable to d/c.   Difficult to place patient No           Subjective:   C/o shortness of breath with exertion.  No chest pain.  Objective:    Vitals:   02/15/20 1200 02/15/20 1300 02/15/20 1330 02/15/20 1440  BP: 105/81 122/88 (!) 123/93 114/89  Pulse: 96 75 73 84  Resp: (!) 24 (!) 22 (!) 26 (!) 26  Temp:    98 F (36.7 C)  TempSrc:    Oral  SpO2: 92% 94% 98% 96%  Weight:      Height:       No data found.  No intake or output data in the 24 hours ending 02/15/20 1608 Filed Weights   02/14/20 0044  Weight: 80 kg    Exam:  GEN: NAD SKIN: No rash EYES: EOMI ENT: MMM CV: RRR PULM: CTA B  ABD: soft, ND, NT, +BS CNS: AAO x 3, non focal EXT: No edema or tenderness   Data Reviewed:   I have personally reviewed following labs and imaging studies:  Labs: Labs show the following:   Basic Metabolic Panel: Recent Labs  Lab 02/09/20 0525 02/10/20 0414 02/11/20 0649 02/14/20 0119 02/15/20 0520  NA 133* 133* 133* 141 136  K 5.4* 4.8 4.7 4.5 5.0  CL 102 101 103 102 100  CO2 21* 22 23 29 26   GLUCOSE 167* 171* 176* 142* 116*  BUN 31* 35* 31* 25* 22*  CREATININE 1.04 1.10 1.10 1.32* 0.97  CALCIUM 9.0 8.9 8.5* 8.7* 8.7*   GFR Estimated Creatinine Clearance: 90.6 mL/min (by C-G formula based on SCr of 0.97 mg/dL). Liver Function Tests: Recent Labs  Lab 02/09/20 0525 02/10/20 0414 02/11/20 0649 02/14/20 0119  AST 19 16 16 19   ALT 41 31 30 39  ALKPHOS 58 47 48 52  BILITOT 1.0 0.9 0.7 0.8  PROT 6.8 6.3* 6.2* 6.3*  ALBUMIN 3.4* 3.2* 3.0* 3.4*   No results for input(s): LIPASE, AMYLASE in the last 168 hours. No results for input(s): AMMONIA in the last 168 hours. Coagulation profile No results for input(s): INR, PROTIME in the  last 168 hours.  CBC: Recent Labs  Lab 02/09/20 0525 02/10/20 0414 02/11/20 0649 02/14/20 0119 02/15/20 0356  WBC 18.6* 19.1* 19.5* 12.0* 11.5*  HGB 15.8 15.1 14.4 14.9 14.2  HCT 47.8 45.3 43.9 46.0 42.4  MCV 92.1 92.3 92.4 94.3 92.8  PLT 290 277 263 235 239   Cardiac Enzymes: No results for input(s): CKTOTAL, CKMB, CKMBINDEX, TROPONINI in the last 168 hours. BNP (last 3 results) No results for input(s): PROBNP in the last 8760 hours. CBG: Recent Labs  Lab 02/14/20 1200 02/14/20 1737 02/14/20 2349 02/15/20 0748 02/15/20 1151  GLUCAP 123* 125* 122* 106* 127*   D-Dimer: No results for input(s): DDIMER in the last 72 hours. Hgb A1c: No results for input(s): HGBA1C in the last 72 hours. Lipid Profile: No results for input(s): CHOL, HDL, LDLCALC, TRIG, CHOLHDL, LDLDIRECT in the last 72 hours. Thyroid function studies: No results for input(s): TSH, T4TOTAL, T3FREE, THYROIDAB in the last 72 hours.  Invalid input(s): FREET3 Anemia work up: No results for input(s): VITAMINB12, FOLATE, FERRITIN, TIBC, IRON, RETICCTPCT in the last 72 hours. Sepsis Labs: Recent Labs  Lab 02/10/20 0414 02/11/20 0649 02/14/20 0119 02/15/20 0356 02/15/20 0520  PROCALCITON  --   --  <0.10  --  <0.10  WBC 19.1* 19.5* 12.0* 11.5*  --     Microbiology Recent Results (from the past 240 hour(s))  Expectorated sputum assessment w rflx to resp cult     Status: None   Collection Time: 02/06/20 10:19 AM   Specimen: Expectorated Sputum  Result Value Ref Range Status   Specimen Description EXPECTORATED SPUTUM  Final   Special Requests NONE  Final   Sputum evaluation   Final    THIS SPECIMEN IS ACCEPTABLE FOR SPUTUM CULTURE Performed at [facility], [address] [place], [address] [place]    Report Status 02/06/2020 FINAL  Final  Culture, respiratory     Status: None   Collection Time: 02/06/20 10:19 AM  Result Value Ref Range Status   Specimen Description   Final     EXPECTORATED SPUTUM Performed at [facility], [address], [place], [address] [place]    Special Requests   Final    NONE Reflexed from [id number] Performed at  [facility], [address] [place], [place] [address]    Gram Stain   Final    RARE WBC PRESENT, PREDOMINANTLY PMN FEW GRAM POSITIVE COCCI IN PAIRS IN CLUSTERS RARE GRAM NEGATIVE RODS    Culture   Final    ABUNDANT Normal respiratory flora-no Staph aureus or Pseudomonas seen Performed at [facility], [address] [place], [place] [address]    Report Status 02/09/2020 FINAL  Final    Procedures and diagnostic studies:  DG Chest 2 View  Result Date: 02/14/2020 CLINICAL DATA:  Shortness of breath. EXAM: CHEST - 2 VIEW COMPARISON:  February 01, 2020 FINDINGS: There are diffuse bilateral hazy airspace opacities which appear to have worsened since the prior study. There is no pneumothorax. No large pleural effusion. The heart size is stable from prior study. There is no acute osseous abnormality. IMPRESSION: Diffuse bilateral hazy airspace opacities concerning for multifocal pneumonia (viral or bacterial). Electronically Signed   By: Katherine Mantle M.D.   On: 02/14/2020 01:28   CT Angio Chest PE W/Cm &/Or Wo Cm  Result Date: 02/14/2020 CLINICAL DATA:  Increased shortness of breath with exertion, discharge with COVID pneumonia yesterday EXAM: CT ANGIOGRAPHY CHEST WITH CONTRAST TECHNIQUE: Multidetector CT imaging of the chest was performed using the standard protocol during bolus administration of intravenous contrast. Multiplanar CT image reconstructions and MIPs were obtained to evaluate the vascular anatomy. CONTRAST:  70mL OMNIPAQUE IOHEXOL 350 MG/ML SOLN COMPARISON:  02/03/2020 FINDINGS: Cardiovascular: Satisfactory opacification of the pulmonary arteries to the level of lobar and some segmental branches. There is motion artifact particularly at the bases. Filling defects are present within right  lower lobe segmental arteries. No definite additional filling defects identified. Normal heart size. No pericardial effusion. Mediastinum/Nodes: Small volume pneumomediastinum. Likely reactive prominent hilar nodes. Thyroid and esophagus are unremarkable. Lungs/Pleura: Persistent bilateral ground-glass and consolidative opacities. Lung aeration has improved compared to the prior study. No pleural effusion. No pneumothorax. Upper Abdomen: No acute abnormality. Musculoskeletal: No acute osseous abnormality. Review of the MIP images confirms the above findings. IMPRESSION: Acute right lower lobe segmental pulmonary emboli. Persistent but improved COVID pneumonia. New small volume pneumomediastinum. These results were called by telephone at the time of interpretation on 02/14/2020 at 10:39 am to provider [facility] , who verbally acknowledged these results. Electronically Signed   By: Guadlupe Spanish M.D.   On: 02/14/2020 10:39   ECHOCARDIOGRAM COMPLETE  Result Date: 02/14/2020    ECHOCARDIOGRAM REPORT   Patient Name:   Jason Cowan [facility] Date of Exam: 02/14/2020 Medical Rec #:  [id number]           Height:       66.0 in Accession #:    [id number]          Weight:       176.4 lb Date of Birth:  02/16/1970           BSA:          1.896 m Patient Age:    49 years            BP:           139/106 mmHg Patient Gender: M                   HR:           89 bpm. Exam Location:  [facility] Procedure: 2D Echo, Cardiac Doppler and Color Doppler Indications:     Pulmonary embolus 415.19 /  I26.99  History:         Patient has no prior history of Echocardiogram examinations.                  Risk Factors:Hypertension.  Sonographer:     Cristela BlueJerry Hege RDCS (AE) Referring Phys:  [id number] Lucile ShuttersCHUKWU AGBATA Diagnosing Phys: Arnoldo HookerBruce Kowalski MD  Sonographer Comments: Suboptimal apical window. IMPRESSIONS  1. Left ventricular ejection fraction, by estimation, is 60 to 65%. The left ventricle has normal function. The left ventricle has no regional wall  motion abnormalities. Left ventricular diastolic parameters were normal.  2. Right ventricular systolic function is normal. The right ventricular size is normal.  3. The mitral valve is normal in structure. Trivial mitral valve regurgitation.  4. The aortic valve is normal in structure. Aortic valve regurgitation is not visualized. FINDINGS  Left Ventricle: Left ventricular ejection fraction, by estimation, is 60 to 65%. The left ventricle has normal function. The left ventricle has no regional wall motion abnormalities. The left ventricular internal cavity size was normal in size. There is  no left ventricular hypertrophy. Left ventricular diastolic parameters were normal. Right Ventricle: The right ventricular size is normal. No increase in right ventricular wall thickness. Right ventricular systolic function is normal. Left Atrium: Left atrial size was normal in size. Right Atrium: Right atrial size was normal in size. Pericardium: There is no evidence of pericardial effusion. Mitral Valve: The mitral valve is normal in structure. Trivial mitral valve regurgitation. Tricuspid Valve: The tricuspid valve is normal in structure. Tricuspid valve regurgitation is trivial. Aortic Valve: The aortic valve is normal in structure. Aortic valve regurgitation is not visualized. Aortic valve mean gradient measures 2.0 mmHg. Aortic valve peak gradient measures 2.4 mmHg. Aortic valve area, by VTI measures 2.66 cm. Pulmonic Valve: The pulmonic valve was normal in structure. Pulmonic valve regurgitation is not visualized. Aorta: The aortic root and ascending aorta are structurally normal, with no evidence of dilitation. IAS/Shunts: No atrial level shunt detected by color flow Doppler.  LEFT VENTRICLE PLAX 2D LVIDd:         3.28 cm  Diastology LVIDs:         2.30 cm  LV e' medial:    5.55 cm/s LV PW:         1.17 cm  LV E/e' medial:  9.9 LV IVS:        0.92 cm  LV e' lateral:   8.81 cm/s LVOT diam:     2.00 cm  LV E/e' lateral:  6.2 LV SV:         36 LV SV Index:   19 LVOT Area:     3.14 cm  RIGHT VENTRICLE RV Basal diam:  2.65 cm RV S prime:     13.40 cm/s TAPSE (M-mode): 4.4 cm LEFT ATRIUM             Index       RIGHT ATRIUM           Index LA diam:        2.10 cm 1.11 cm/m  RA Area:     14.70 cm LA Vol (A2C):   33.6 ml 17.72 ml/m RA Volume:   38.20 ml  20.15 ml/m LA Vol (A4C):   38.5 ml 20.31 ml/m LA Biplane Vol: 38.3 ml 20.20 ml/m  AORTIC VALVE                   PULMONIC VALVE AV Area (Vmax):  2.59 cm    PV Vmax:        0.47 m/s AV Area (Vmean):   2.47 cm    PV Peak grad:   0.9 mmHg AV Area (VTI):     2.66 cm    RVOT Peak grad: 1 mmHg AV Vmax:           77.50 cm/s AV Vmean:          59.400 cm/s AV VTI:            0.136 m AV Peak Grad:      2.4 mmHg AV Mean Grad:      2.0 mmHg LVOT Vmax:         64.00 cm/s LVOT Vmean:        46.700 cm/s LVOT VTI:          0.115 m LVOT/AV VTI ratio: 0.85  AORTA Ao Root diam: 3.32 cm MITRAL VALVE               TRICUSPID VALVE MV Area (PHT): 3.74 cm    TR Peak grad:   7.1 mmHg MV Decel Time: 203 msec    TR Vmax:        133.00 cm/s MV E velocity: 54.80 cm/s MV A velocity: 54.40 cm/s  SHUNTS MV E/A ratio:  1.01        Systemic VTI:  0.12 m                            Systemic Diam: 2.00 cm Arnoldo HookerBruce Kowalski MD Electronically signed by Arnoldo HookerBruce Kowalski MD Signature Date/Time: 02/14/2020/6:17:40 PM    Final                LOS: 1 day   Amirra Herling  [facility]   Pager on [email or web address]. If 7PM-7AM, please contact night-coverage at [email or web address]     02/15/2020, 4:08 PM

## 2020-02-15 NOTE — ED Notes (Signed)
Pt had a mild nosebleed.  Placed humidifier on O2 to see if this helps.  Advised pt to notify me if he has another episode.  Provider notified.

## 2020-02-16 ENCOUNTER — Encounter: Payer: Self-pay | Admitting: Internal Medicine

## 2020-02-16 ENCOUNTER — Inpatient Hospital Stay: Payer: BC Managed Care – PPO

## 2020-02-16 LAB — CBG MONITORING, ED
Glucose-Capillary: 88 mg/dL (ref 70–99)
Glucose-Capillary: 156 mg/dL — ABNORMAL HIGH (ref 70–99)
Glucose-Capillary: 120 mg/dL — ABNORMAL HIGH (ref 70–99)

## 2020-02-16 LAB — GLUCOSE, CAPILLARY: Glucose-Capillary: 110 mg/dL — ABNORMAL HIGH (ref 70–99)

## 2020-02-16 LAB — PROCALCITONIN: Procalcitonin: <0.1 ng/mL

## 2020-02-16 MED ORDER — APIXABAN 5 MG PO TABS: 5.0000 mg | ORAL_TABLET | Freq: Two times a day (BID) | ORAL | Status: DC

## 2020-02-16 MED ORDER — IOHEXOL 350 MG/ML SOLN
75.0000 mL | Freq: Once | INTRAVENOUS | Status: AC | PRN
  Administered 2020-02-16: 75 mL via INTRAVENOUS
  Filled 2020-02-16: qty 75

## 2020-02-16 MED ORDER — APIXABAN 5 MG PO TABS
10.0000 mg | ORAL_TABLET | Freq: Two times a day (BID) | ORAL | Status: DC
  Administered 2020-02-16 – 2020-02-21 (×11): 10 mg via ORAL
  Filled 2020-02-16: qty 2
  Filled 2020-02-16: qty 4
  Filled 2020-02-16 (×10): qty 2

## 2020-02-16 NOTE — ED Notes (Signed)
Report received by Bosie Clos, RN- transport requested.

## 2020-02-16 NOTE — ED Notes (Signed)
Pt to CT on NRB

## 2020-02-16 NOTE — ED Notes (Signed)
Pt sat up in chair while this nurse placed him on a hospital bed. Pt O2 saturations dropped to 84% and slow to recover. Pt placed back in bed and placed on 5L O2 via [place]. Pt's RR and saturations have resolved. Pt currently saturating 94% on 4L, and his RR has come down to 25

## 2020-02-16 NOTE — ED Notes (Signed)
Pt asleep at this time, NAD noted. Pt remains on 4 liter's [place] humidified.

## 2020-02-16 NOTE — ED Notes (Signed)
Daryll Brod, RN messaged with SBAR at this time.

## 2020-02-16 NOTE — Consult Note (Signed)
ANTICOAGULATION CONSULT NOTE - Follow Up Consult  Pharmacy Consult for transition therapeutic Lovenox to Apixaban Indication: Acute pulmonary embolus  No Known Allergies  Patient Measurements: Height: 5\' 6"  (167.6 cm) Weight: 80 kg (176 lb 5.9 oz) IBW/kg (Calculated) : 63.8  Vital Signs: BP: 108/75 (01/22 1100) Pulse Rate: 70 (01/22 1100)  Labs: Recent Labs    02/14/20 0119 02/15/20 0356 02/15/20 0520  HGB 14.9 14.2  --   HCT 46.0 42.4  --   PLT 235 239  --   CREATININE 1.32*  --  0.97  TROPONINIHS 8  --   --     Estimated Creatinine Clearance: 90.6 mL/min (by C-G formula based on SCr of 0.97 mg/dL).   Medications:  No APT or AC PTA. Receiving lovenox Tx during this admission.   Assessment: 50yo Male with PMH T2DM and HTN with recent diagnosis of COVID-19 PNA with development of Acute PE. Pharmacy consulted to manage transition of therapeutic lovenox to Eliquis.  Goal of Therapy:  Monitor platelets by anticoagulation protocol: Yes   Plan:  DC Lovenox and start Eliquis 10mg  at next dosing interval. Last lovenox 80mg  dose 1/21 1950 after receiving 2days of therapeutic AC will start Eliquis 10mg  BID x5 days; followed by 5mg  BID thereafter.  Jakyra Kenealy 02/16/2020,12:55 PM

## 2020-02-16 NOTE — ED Notes (Signed)
Pt up to side of bed to urinate. O2 saturations dropped to 62% on 4L and RR increased to 45/min. Pt laid back in bed. NRB placed on pt until saturations came back up. Pt currently on 4L with RR 25 and O2 saturation of 94%. Provider notified

## 2020-02-16 NOTE — Progress Notes (Addendum)
Progress Note    Jason Cowan  [id number]:[id number] DOB: Apr 06, 1970  DOA: 02/14/2020 PCP: Patient, No Pcp Per      Brief Narrative:    Medical records reviewed and are as summarized below:  Jason Cowan is a 50 y.o. male       Assessment/Plan:   Principal Problem:   Acute pulmonary embolism (HCC) Active Problems:   Pneumonia due to COVID-19 virus   HTN (hypertension)   Type 2 diabetes mellitus without complication (HCC)   Acute on chronic respiratory failure with hypoxia (HCC)   Pneumomediastinum (HCC)   Body mass index is 28.47 kg/m.    Repeat CT of the chest did not show any residual pneumomediastinum. Confirmed known pulmonary embolism but there was evidence of new right upper lobe and left lower lobe segmental pulmonary emboli.   Change Lovenox to Eliquis.  Continue 4 L/min oxygen via nasal cannula.  Taper down oxygen as able.  He was recently discharged home on 2 L/min oxygen.   He recently completed a course of steroids and remdesivir for COVID-19 pneumonia.      Diet Order            Diet Carb Modified Fluid consistency: Thin; Room service appropriate? Yes  Diet effective now                    Consultants:  None  Procedures:  None    Medications:   . apixaban  10 mg Oral BID   Followed by  . [START ON 02/21/2020] apixaban  5 mg Oral BID  . insulin aspart  0-15 Units Subcutaneous TID WC  . multivitamin with minerals  1 tablet Oral Daily  . sodium chloride flush  3 mL Intravenous Q12H   Continuous Infusions: . sodium chloride       Anti-infectives (From admission, onward)   None             Family Communication/Anticipated D/C date and plan/Code Status   DVT prophylaxis:      Code Status: Full Code  Family Communication: None Disposition Plan:    Status is: Inpatient  Remains inpatient appropriate because:Inpatient level of care appropriate due to severity of illness   Dispo: The patient  is from: Home              Anticipated d/c is to: Home              Anticipated d/c date is: 1 day              Patient currently is not medically stable to d/c.   Difficult to place patient No           Subjective:   Interval events noted.  His oxygen saturation dropped to 62% on 4 L and he became tachypneic when he stood up to urinate at the bedside.  C/o shortness of breath with minimal exertion.  Objective:    Vitals:   02/16/20 1200 02/16/20 1300 02/16/20 1330 02/16/20 1405  BP: 122/85     Pulse: 84 73 62 70  Resp: 15  (!) 24 (!) 24  Temp:      TempSrc:      SpO2: 95% 97% 99% 100%  Weight:      Height:       No data found.   Intake/Output Summary (Last 24 hours) at 02/16/2020 1444 Last data filed at 02/16/2020 0952 Gross per 24 hour  Intake --  Output  600 ml  Net -600 ml   Filed Weights   02/14/20 0044  Weight: 80 kg    Exam:   GEN: NAD SKIN: No rash EYES: EOMI ENT: MMM CV: RRR PULM: CTA B ABD: soft, ND, NT, +BS CNS: AAO x 3, non focal EXT: No edema or tenderness       Data Reviewed:   I have personally reviewed following labs and imaging studies:  Labs: Labs show the following:   Basic Metabolic Panel: Recent Labs  Lab 02/10/20 0414 02/11/20 0649 02/14/20 0119 02/15/20 0520  NA 133* 133* 141 136  K 4.8 4.7 4.5 5.0  CL 101 103 102 100  CO2 22 23 29 26   GLUCOSE 171* 176* 142* 116*  BUN 35* 31* 25* 22*  CREATININE 1.10 1.10 1.32* 0.97  CALCIUM 8.9 8.5* 8.7* 8.7*   GFR Estimated Creatinine Clearance: 90.6 mL/min (by C-G formula based on SCr of 0.97 mg/dL). Liver Function Tests: Recent Labs  Lab 02/10/20 0414 02/11/20 0649 02/14/20 0119  AST 16 16 19   ALT 31 30 39  ALKPHOS 47 48 52  BILITOT 0.9 0.7 0.8  PROT 6.3* 6.2* 6.3*  ALBUMIN 3.2* 3.0* 3.4*   No results for input(s): LIPASE, AMYLASE in the last 168 hours. No results for input(s): AMMONIA in the last 168 hours. Coagulation profile No results for input(s):  INR, PROTIME in the last 168 hours.  CBC: Recent Labs  Lab 02/10/20 0414 02/11/20 0649 02/14/20 0119 02/15/20 0356  WBC 19.1* 19.5* 12.0* 11.5*  HGB 15.1 14.4 14.9 14.2  HCT 45.3 43.9 46.0 42.4  MCV 92.3 92.4 94.3 92.8  PLT 277 263 235 239   Cardiac Enzymes: No results for input(s): CKTOTAL, CKMB, CKMBINDEX, TROPONINI in the last 168 hours. BNP (last 3 results) No results for input(s): PROBNP in the last 8760 hours. CBG: Recent Labs  Lab 02/15/20 1151 02/15/20 1714 02/15/20 2247 02/16/20 0803 02/16/20 1134  GLUCAP 127* 197* 134* 88 156*   D-Dimer: No results for input(s): DDIMER in the last 72 hours. Hgb A1c: No results for input(s): HGBA1C in the last 72 hours. Lipid Profile: No results for input(s): CHOL, HDL, LDLCALC, TRIG, CHOLHDL, LDLDIRECT in the last 72 hours. Thyroid function studies: No results for input(s): TSH, T4TOTAL, T3FREE, THYROIDAB in the last 72 hours.  Invalid input(s): FREET3 Anemia work up: No results for input(s): VITAMINB12, FOLATE, FERRITIN, TIBC, IRON, RETICCTPCT in the last 72 hours. Sepsis Labs: Recent Labs  Lab 02/10/20 0414 02/11/20 0649 02/14/20 0119 02/15/20 0356 02/15/20 0520 02/16/20 0334  PROCALCITON  --   --  <0.10  --  <0.10 <0.10  WBC 19.1* 19.5* 12.0* 11.5*  --   --     Microbiology No results found for this or any previous visit (from the past 240 hour(s)).  Procedures and diagnostic studies:  CT ANGIO CHEST PE W OR WO CONTRAST  Result Date: 02/16/2020 CLINICAL DATA:  Worsening shortness of breath. EXAM: CT ANGIOGRAPHY CHEST WITH CONTRAST TECHNIQUE: Multidetector CT imaging of the chest was performed using the standard protocol during bolus administration of intravenous contrast. Multiplanar CT image reconstructions and MIPs were obtained to evaluate the vascular anatomy. CONTRAST:  41mL OMNIPAQUE IOHEXOL 350 MG/ML SOLN COMPARISON:  Chest CT 02/14/2020. FINDINGS: Cardiovascular: Normal heart size. Adequate  opacification of the pulmonary arterial system. Motion artifact limits evaluation. Previously visualized right lower lobe pulmonary embolus is not as well demonstrated on current exam. There is a small left lower lobe pulmonary embolus. New right upper  lobe segmental pulmonary embolus. Mediastinum/Nodes: No enlarged axillary, mediastinal or hilar lymphadenopathy. Small hiatal hernia. Lungs/Pleura: Central airways are patent. Similar-appearing diffuse bilateral peripheral consolidative and ground-glass opacities. No pleural effusion or pneumothorax. Upper Abdomen: No acute process. Musculoskeletal: No aggressive or acute appearing osseous lesions. Review of the MIP images confirms the above findings. IMPRESSION: 1. Patient with known pulmonary embolus. New right upper lobe and left lower lobe segmental pulmonary emboli. Previously visualized right lower lobe pulmonary embolus is not as well demonstrated on current exam. 2. Similar-appearing diffuse bilateral peripheral consolidative and ground-glass opacities which may represent atypical/viral infectious process. 3. No definite residual pneumomediastinum visualized. Electronically Signed   By: Annia Belt M.D.   On: 02/16/2020 12:44               LOS: 2 days   Aritza Brunet  [facility]   Pager on [email or web address]. If 7PM-7AM, please contact night-coverage at [email or web address]     02/16/2020, 2:44 PM

## 2020-02-17 LAB — GLUCOSE, CAPILLARY
Glucose-Capillary: 94 mg/dL (ref 70–99)
Glucose-Capillary: 108 mg/dL — ABNORMAL HIGH (ref 70–99)
Glucose-Capillary: 116 mg/dL — ABNORMAL HIGH (ref 70–99)
Glucose-Capillary: 128 mg/dL — ABNORMAL HIGH (ref 70–99)

## 2020-02-17 NOTE — Progress Notes (Signed)
Progress Note    Jason Cowan  LAG:[id number] DOB: [birth year]  DOA: 02/14/2020 PCP: Patient, No Pcp Per      Brief Narrative:    Medical records reviewed and are as summarized below:  Jason Cowan is a 50 y.o. male       Assessment/Plan:   Principal Problem:   Acute pulmonary embolism (HCC) Active Problems:   Pneumonia due to COVID-19 virus   HTN (hypertension)   Type 2 diabetes mellitus without complication (HCC)   Acute on chronic respiratory failure with hypoxia (HCC)   Pneumomediastinum (HCC)   Body mass index is 22.31 kg/m.    Repeat CT of the chest did not show any residual pneumomediastinum. Confirmed known  pulmonary embolism but there was evidence of new right upper lobe and left lower lobe segmental pulmonary emboli.  Continue Eliquis  Continue 6 L/min oxygen via [place] and taper down oxygen as able. He was recently discharged home on 2 L/min oxygen.   He recently completed a course of steroids and remdesivir for COVID-19 pneumonia.      Diet Order            Diet Carb Modified Fluid consistency: Thin; Room service appropriate? Yes  Diet effective now                    Consultants:  None  Procedures:  None    Medications:   . apixaban  10 mg Oral BID   Followed by  . [START ON 02/21/2020] apixaban  5 mg Oral BID  . insulin aspart  0-15 Units Subcutaneous TID WC  . multivitamin with minerals  1 tablet Oral Daily  . sodium chloride flush  3 mL Intravenous Q12H   Continuous Infusions: . sodium chloride       Anti-infectives (From admission, onward)   None             Family Communication/Anticipated D/C date and plan/Code Status   DVT prophylaxis:      Code Status: Full Code  Family Communication: None Disposition Plan:    Status is: Inpatient  Remains inpatient appropriate because:Inpatient level of care appropriate due to severity of illness   Dispo: The patient is from: Home               Anticipated d/c is to: Home              Anticipated d/c date is: 1 day              Patient currently is not medically stable to d/c.   Difficult to place patient No           Subjective:   C/o shortness of breath and chest tightness.  Interval events noted.  Oxygen requirement is up to 6 L/min.  Objective:    Vitals:   02/17/20 0336 02/17/20 0415 02/17/20 0803 02/17/20 1156  BP: (!) 134/94  (!) 132/94 119/86  Pulse: (!) 58  64 77  Resp: 18   19  Temp: 98.4 F (36.9 C)  98 F (36.7 C) 98 F (36.7 C)  TempSrc: Oral  Oral Oral  SpO2: 99%  97% 99%  Weight:  62.7 kg    Height:       No data found.   Intake/Output Summary (Last 24 hours) at 02/17/2020 1230 Last data filed at 02/16/2020 2113 Gross per 24 hour  Intake --  Output 500 ml  Net -500 ml  Filed Weights   02/14/20 0044 02/17/20 0415  Weight: 80 kg 62.7 kg    Exam:   GEN: NAD SKIN: No rash EYES: EOMI ENT: MMM CV: RRR PULM: CTA B ABD: soft, ND, NT, +BS CNS: AAO x 3, non focal EXT: No edema or tenderness        Data Reviewed:   I have personally reviewed following labs and imaging studies:  Labs: Labs show the following:   Basic Metabolic Panel: Recent Labs  Lab 02/11/20 0649 02/14/20 0119 02/15/20 0520  NA 133* 141 136  K 4.7 4.5 5.0  CL 103 102 100  CO2 23 29 26   GLUCOSE 176* 142* 116*  BUN 31* 25* 22*  CREATININE 1.10 1.32* 0.97  CALCIUM 8.5* 8.7* 8.7*   GFR Estimated Creatinine Clearance: 80.8 mL/min (by C-G formula based on SCr of 0.97 mg/dL). Liver Function Tests: Recent Labs  Lab 02/11/20 0649 02/14/20 0119  AST 16 19  ALT 30 39  ALKPHOS 48 52  BILITOT 0.7 0.8  PROT 6.2* 6.3*  ALBUMIN 3.0* 3.4*   No results for input(s): LIPASE, AMYLASE in the last 168 hours. No results for input(s): AMMONIA in the last 168 hours. Coagulation profile No results for input(s): INR, PROTIME in the last 168 hours.  CBC: Recent Labs  Lab 02/11/20 0649  02/14/20 0119 02/15/20 0356  WBC 19.5* 12.0* 11.5*  HGB 14.4 14.9 14.2  HCT 43.9 46.0 42.4  MCV 92.4 94.3 92.8  PLT 263 235 239   Cardiac Enzymes: No results for input(s): CKTOTAL, CKMB, CKMBINDEX, TROPONINI in the last 168 hours. BNP (last 3 results) No results for input(s): PROBNP in the last 8760 hours. CBG: Recent Labs  Lab 02/16/20 1134 02/16/20 1620 02/16/20 2113 02/17/20 0917 02/17/20 1157  GLUCAP 156* 120* 110* 94 108*   D-Dimer: No results for input(s): DDIMER in the last 72 hours. Hgb A1c: No results for input(s): HGBA1C in the last 72 hours. Lipid Profile: No results for input(s): CHOL, HDL, LDLCALC, TRIG, CHOLHDL, LDLDIRECT in the last 72 hours. Thyroid function studies: No results for input(s): TSH, T4TOTAL, T3FREE, THYROIDAB in the last 72 hours.  Invalid input(s): FREET3 Anemia work up: No results for input(s): VITAMINB12, FOLATE, FERRITIN, TIBC, IRON, RETICCTPCT in the last 72 hours. Sepsis Labs: Recent Labs  Lab 02/11/20 0649 02/14/20 0119 02/15/20 0356 02/15/20 0520 02/16/20 0334  PROCALCITON  --  <0.10  --  <0.10 <0.10  WBC 19.5* 12.0* 11.5*  --   --     Microbiology No results found for this or any previous visit (from the past 240 hour(s)).  Procedures and diagnostic studies:  CT ANGIO CHEST PE W OR WO CONTRAST  Result Date: 02/16/2020 CLINICAL DATA:  Worsening shortness of breath. EXAM: CT ANGIOGRAPHY CHEST WITH CONTRAST TECHNIQUE: Multidetector CT imaging of the chest was performed using the standard protocol during bolus administration of intravenous contrast. Multiplanar CT image reconstructions and MIPs were obtained to evaluate the vascular anatomy. CONTRAST:  20mL OMNIPAQUE IOHEXOL 350 MG/ML SOLN COMPARISON:  Chest CT 02/14/2020. FINDINGS: Cardiovascular: Normal heart size. Adequate opacification of the pulmonary arterial system. Motion artifact limits evaluation. Previously visualized right lower lobe pulmonary embolus is not as well  demonstrated on current exam. There is a small left lower lobe pulmonary embolus. New right upper lobe segmental pulmonary embolus. Mediastinum/Nodes: No enlarged axillary, mediastinal or hilar lymphadenopathy. Small hiatal hernia. Lungs/Pleura: Central airways are patent. Similar-appearing diffuse bilateral peripheral consolidative and ground-glass opacities. No pleural effusion or pneumothorax. Upper  Abdomen: No acute process. Musculoskeletal: No aggressive or acute appearing osseous lesions. Review of the MIP images confirms the above findings. IMPRESSION: 1. Patient with known pulmonary embolus. New right upper lobe and left lower lobe segmental pulmonary emboli. Previously visualized right lower lobe pulmonary embolus is not as well demonstrated on current exam. 2. Similar-appearing diffuse bilateral peripheral consolidative and ground-glass opacities which may represent atypical/viral infectious process. 3. No definite residual pneumomediastinum visualized. Electronically Signed   By: Annia Belt M.D.   On: 02/16/2020 12:44               LOS: 3 days   Torsha Lemus  [facility]   Pager on [email or web address]. If 7PM-7AM, please contact night-coverage at [email or web address]     02/17/2020, 12:30 PM

## 2020-02-17 NOTE — Plan of Care (Signed)
  Problem: Clinical Measurements: Goal: Respiratory complications will improve Outcome: Not Progressing  Continues to be SOB with exertion

## 2020-02-18 LAB — CBC
HCT: 41.5 % (ref 39.0–52.0)
Hemoglobin: 13.4 g/dL (ref 13.0–17.0)
MCH: 30.5 pg (ref 26.0–34.0)
MCHC: 32.3 g/dL (ref 30.0–36.0)
MCV: 94.3 fL (ref 80.0–100.0)
Platelets: 240 10*3/uL (ref 150–400)
RBC: 4.4 MIL/uL (ref 4.22–5.81)
RDW: 14.3 % (ref 11.5–15.5)
WBC: 6.5 10*3/uL (ref 4.0–10.5)
nRBC: 0 % (ref 0.0–0.2)

## 2020-02-18 LAB — GLUCOSE, CAPILLARY
Glucose-Capillary: 111 mg/dL — ABNORMAL HIGH (ref 70–99)
Glucose-Capillary: 152 mg/dL — ABNORMAL HIGH (ref 70–99)
Glucose-Capillary: 104 mg/dL — ABNORMAL HIGH (ref 70–99)
Glucose-Capillary: 116 mg/dL — ABNORMAL HIGH (ref 70–99)

## 2020-02-18 MED ORDER — ALPRAZOLAM 0.25 MG PO TABS
0.2500 mg | ORAL_TABLET | Freq: Once | ORAL | Status: AC
  Administered 2020-02-18: 0.25 mg via ORAL
  Filled 2020-02-18: qty 1

## 2020-02-18 NOTE — Plan of Care (Signed)
  Problem: Clinical Measurements: Goal: Ability to maintain clinical measurements within normal limits will improve Outcome: Progressing   

## 2020-02-18 NOTE — Progress Notes (Signed)
Progress Note    Jason Cowan  ZOX:[id number] DOB: 09/01/1970  DOA: 02/14/2020 PCP: Patient, No Pcp Per      Brief Narrative:    Medical records reviewed and are as summarized below:  Jason Cowan is a 50 y.o. male       Assessment/Plan:   Principal Problem:   Acute pulmonary embolism (HCC) Active Problems:   Pneumonia due to COVID-19 virus   HTN (hypertension)   Type 2 diabetes mellitus without complication (HCC)   Acute on chronic respiratory failure with hypoxia (HCC)   Pneumomediastinum (HCC)   Body mass index is 22.31 kg/m.    Repeat CT of the chest did not show any residual pneumomediastinum. Confirmed known  pulmonary embolism but there was evidence of new right upper lobe and left lower lobe segmental pulmonary emboli.  Continue Eliquis for acute PE.  Continue 6 L/min oxygen via nasal cannula and taper down oxygen as able.  He was recently discharged home on 2 L/min oxygen   He recently completed a course of steroids and remdesivir for COVID-19 pneumonia.   1 dose of Xanax was prescribed for anxiety.   Diet Order            Diet Heart Room service appropriate? Yes; Fluid consistency: Thin  Diet effective now                    Consultants:  None  Procedures:  None    Medications:   . apixaban  10 mg Oral BID   Followed by  . [START ON 02/23/2020] apixaban  5 mg Oral BID  . insulin aspart  0-15 Units Subcutaneous TID WC  . multivitamin with minerals  1 tablet Oral Daily  . sodium chloride flush  3 mL Intravenous Q12H   Continuous Infusions: . sodium chloride       Anti-infectives (From admission, onward)   None             Family Communication/Anticipated D/C date and plan/Code Status   DVT prophylaxis:      Code Status: Full Code  Family Communication: None Disposition Plan:    Status is: Inpatient  Remains inpatient appropriate because:Inpatient level of care appropriate due to  severity of illness   Dispo: The patient is from: Home              Anticipated d/c is to: Home              Anticipated d/c date is: 1 day              Patient currently is not medically stable to d/c.   Difficult to place patient No           Subjective:   C/o cough and shortness of breath.  Attempt was made to decrease oxygen from 6 L that he had severe coughing spell and increased shortness of breath.  He had to be placed on oxygen via nonrebreather mask temporarily and was then switched back to 6 L/min via nasal cannula.  Objective:    Vitals:   02/18/20 0850 02/18/20 0858 02/18/20 0900 02/18/20 1111  BP:  (!) 130/98  (!) 115/91  Pulse:    84  Resp:    20  Temp:    98.2 F (36.8 C)  TempSrc:    Oral  SpO2: 100% 98% 94% 100%  Weight:      Height:       No data  found.   Intake/Output Summary (Last 24 hours) at 02/18/2020 1453 Last data filed at 02/18/2020 0300 Gross per 24 hour  Intake --  Output 500 ml  Net -500 ml   Filed Weights   02/14/20 0044 02/17/20 0415  Weight: 80 kg 62.7 kg    Exam:   GEN: NAD SKIN: No rash EYES: EOMI ENT: MMM CV: RRR PULM: CTA B ABD: soft, ND, NT, +BS CNS: AAO x 3, non focal EXT: No edema or tenderness       Data Reviewed:   I have personally reviewed following labs and imaging studies:  Labs: Labs show the following:   Basic Metabolic Panel: Recent Labs  Lab 02/14/20 0119 02/15/20 0520  NA 141 136  K 4.5 5.0  CL 102 100  CO2 29 26  GLUCOSE 142* 116*  BUN 25* 22*  CREATININE 1.32* 0.97  CALCIUM 8.7* 8.7*   GFR Estimated Creatinine Clearance: 80.8 mL/min (by C-G formula based on SCr of 0.97 mg/dL). Liver Function Tests: Recent Labs  Lab 02/14/20 0119  AST 19  ALT 39  ALKPHOS 52  BILITOT 0.8  PROT 6.3*  ALBUMIN 3.4*   No results for input(s): LIPASE, AMYLASE in the last 168 hours. No results for input(s): AMMONIA in the last 168 hours. Coagulation profile No results for input(s): INR,  PROTIME in the last 168 hours.  CBC: Recent Labs  Lab 02/14/20 0119 02/15/20 0356 02/18/20 0244  WBC 12.0* 11.5* 6.5  HGB 14.9 14.2 13.4  HCT 46.0 42.4 41.5  MCV 94.3 92.8 94.3  PLT 235 239 240   Cardiac Enzymes: No results for input(s): CKTOTAL, CKMB, CKMBINDEX, TROPONINI in the last 168 hours. BNP (last 3 results) No results for input(s): PROBNP in the last 8760 hours. CBG: Recent Labs  Lab 02/17/20 1157 02/17/20 1621 02/17/20 2136 02/18/20 0818 02/18/20 1138  GLUCAP 108* 116* 128* 111* 152*   D-Dimer: No results for input(s): DDIMER in the last 72 hours. Hgb A1c: No results for input(s): HGBA1C in the last 72 hours. Lipid Profile: No results for input(s): CHOL, HDL, LDLCALC, TRIG, CHOLHDL, LDLDIRECT in the last 72 hours. Thyroid function studies: No results for input(s): TSH, T4TOTAL, T3FREE, THYROIDAB in the last 72 hours.  Invalid input(s): FREET3 Anemia work up: No results for input(s): VITAMINB12, FOLATE, FERRITIN, TIBC, IRON, RETICCTPCT in the last 72 hours. Sepsis Labs: Recent Labs  Lab 02/14/20 0119 02/15/20 0356 02/15/20 0520 02/16/20 0334 02/18/20 0244  PROCALCITON <0.10  --  <0.10 <0.10  --   WBC 12.0* 11.5*  --   --  6.5    Microbiology No results found for this or any previous visit (from the past 240 hour(s)).  Procedures and diagnostic studies:  No results found.             LOS: 4 days   Keryn Nessler  [facility]   Pager on [email or web address]. If 7PM-7AM, please contact night-coverage at [email or web address]     02/18/2020, 2:53 PM

## 2020-02-18 NOTE — Plan of Care (Signed)

## 2020-02-19 LAB — GLUCOSE, CAPILLARY
Glucose-Capillary: 109 mg/dL — ABNORMAL HIGH (ref 70–99)
Glucose-Capillary: 170 mg/dL — ABNORMAL HIGH (ref 70–99)
Glucose-Capillary: 108 mg/dL — ABNORMAL HIGH (ref 70–99)
Glucose-Capillary: 115 mg/dL — ABNORMAL HIGH (ref 70–99)

## 2020-02-19 NOTE — Progress Notes (Addendum)
Progress Note    Jason Cowan  ZOX:[id number] DOB: 1970/03/26  DOA: 02/14/2020 PCP: Patient, No Pcp Per      Brief Narrative:    Medical records reviewed and are as summarized below:  Jason Cowan is a 50 y.o. male with medical history significant for borderline diabetes mellitus, hypertension, recent hospitalization for COVID-19 pneumonia from 02/01/2020 through 02/13/2020 and was discharged home on 2 L/min oxygen, presented to the hospital with worsening shortness of breath within 24 hours of discharge from the hospital.  He was found to have acute pulmonary embolism and a small pneumomediastinum.  He was treated with Lovenox and was transitioned to Eliquis.  He required 6 L/min oxygen for acute on chronic hypoxic respiratory failure.  Repeat CTA of the chest showed resolution of small pneumomediastinum.    Assessment/Plan:   Principal Problem:   Acute pulmonary embolism (HCC) Active Problems:   Pneumonia due to COVID-19 virus   HTN (hypertension)   Type 2 diabetes mellitus without complication (HCC)   Acute on chronic respiratory failure with hypoxia (HCC)   Pneumomediastinum (HCC)   Body mass index is 22.26 kg/m.    Repeat CT of the chest did not show any residual pneumomediastinum. Confirmed known  pulmonary embolism but there was evidence of new right upper lobe and left lower lobe segmental pulmonary emboli.  Continue Eliquis for acute PE.  He's still requiring 6 L/min oxygen via [facility].  He becomes tachycardic with oxygen saturation drops into the upper 80s with mild activity.   He recently completed a course of steroids and remdesivir for COVID-19 pneumonia.   He complained about dietary restrictions.  I explained the importance of a heart healthy diet and he was okay with that.   Diet Order            Diet Heart Room service appropriate? Yes; Fluid consistency: Thin  Diet effective now                     Consultants:  None  Procedures:  None    Medications:   . apixaban  10 mg Oral BID   Followed by  . [START ON 02/23/2020] apixaban  5 mg Oral BID  . insulin aspart  0-15 Units Subcutaneous TID WC  . multivitamin with minerals  1 tablet Oral Daily  . sodium chloride flush  3 mL Intravenous Q12H   Continuous Infusions: . sodium chloride       Anti-infectives (From admission, onward)   None             Family Communication/Anticipated D/C date and plan/Code Status   DVT prophylaxis:      Code Status: Full Code  Family Communication: None Disposition Plan:    Status is: Inpatient  Remains inpatient appropriate because:Inpatient level of care appropriate due to severity of illness   Dispo: The patient is from: Home              Anticipated d/c is to: Home              Anticipated d/c date is: 1 day              Patient currently is not medically stable to d/c.   Difficult to place patient No           Subjective:   He is frustrated about lack of improvement.  He said he gets short of breath and his oxygen level drops just by  going from his bed to the sink.  Objective:    Vitals:   02/19/20 0349 02/19/20 0747 02/19/20 1126 02/19/20 1300  BP: (!) 117/95 137/90 120/83   Pulse: 80 82 79   Resp: 20 19 19    Temp: 98.1 F (36.7 C) 98.2 F (36.8 C) 98 F (36.7 C)   TempSrc: Oral Oral Oral   SpO2: 100% 100% 100% 97%  Weight: 62.6 kg     Height:       No data found.   Intake/Output Summary (Last 24 hours) at 02/19/2020 1457 Last data filed at 02/19/2020 1004 Gross per 24 hour  Intake 483 ml  Output 2000 ml  Net -1517 ml   Filed Weights   02/14/20 0044 02/17/20 0415 02/19/20 0349  Weight: 80 kg 62.7 kg 62.6 kg    Exam:  I observed him going from his bed to the the sink (only a few steps away) and oxygen saturation dropped from 96% to 87% on 6 L/min oxygen.  He also become tachycardic and heart rate went up into the  130s.   GEN: NAD SKIN: No rash EYES: EOMI ENT: MMM CV: Regular rate, tachycardic PULM: CTA B ABD: soft, ND, NT, +BS CNS: AAO x 3, non focal EXT: No edema or tenderness       Data Reviewed:   I have personally reviewed following labs and imaging studies:  Labs: Labs show the following:   Basic Metabolic Panel: Recent Labs  Lab 02/14/20 0119 02/15/20 0520  NA 141 136  K 4.5 5.0  CL 102 100  CO2 29 26  GLUCOSE 142* 116*  BUN 25* 22*  CREATININE 1.32* 0.97  CALCIUM 8.7* 8.7*   GFR Estimated Creatinine Clearance: 80.7 mL/min (by C-G formula based on SCr of 0.97 mg/dL). Liver Function Tests: Recent Labs  Lab 02/14/20 0119  AST 19  ALT 39  ALKPHOS 52  BILITOT 0.8  PROT 6.3*  ALBUMIN 3.4*   No results for input(s): LIPASE, AMYLASE in the last 168 hours. No results for input(s): AMMONIA in the last 168 hours. Coagulation profile No results for input(s): INR, PROTIME in the last 168 hours.  CBC: Recent Labs  Lab 02/14/20 0119 02/15/20 0356 02/18/20 0244  WBC 12.0* 11.5* 6.5  HGB 14.9 14.2 13.4  HCT 46.0 42.4 41.5  MCV 94.3 92.8 94.3  PLT 235 239 240   Cardiac Enzymes: No results for input(s): CKTOTAL, CKMB, CKMBINDEX, TROPONINI in the last 168 hours. BNP (last 3 results) No results for input(s): PROBNP in the last 8760 hours. CBG: Recent Labs  Lab 02/18/20 1138 02/18/20 1719 02/18/20 2059 02/19/20 0745 02/19/20 1126  GLUCAP 152* 104* 116* 109* 170*   D-Dimer: No results for input(s): DDIMER in the last 72 hours. Hgb A1c: No results for input(s): HGBA1C in the last 72 hours. Lipid Profile: No results for input(s): CHOL, HDL, LDLCALC, TRIG, CHOLHDL, LDLDIRECT in the last 72 hours. Thyroid function studies: No results for input(s): TSH, T4TOTAL, T3FREE, THYROIDAB in the last 72 hours.  Invalid input(s): FREET3 Anemia work up: No results for input(s): VITAMINB12, FOLATE, FERRITIN, TIBC, IRON, RETICCTPCT in the last 72 hours. Sepsis  Labs: Recent Labs  Lab 02/14/20 0119 02/15/20 0356 02/15/20 0520 02/16/20 0334 02/18/20 0244  PROCALCITON <0.10  --  <0.10 <0.10  --   WBC 12.0* 11.5*  --   --  6.5    Microbiology No results found for this or any previous visit (from the past 240 hour(s)).  Procedures and  diagnostic studies:  No results found.             LOS: 5 days   Jayne Peckenpaugh  [facility]   Pager on [email or web address]. If 7PM-7AM, please contact night-coverage at [email or web address]     02/19/2020, 2:57 PM

## 2020-02-20 DIAGNOSIS — J9601 Acute respiratory failure with hypoxia: Secondary | ICD-10-CM

## 2020-02-20 LAB — GLUCOSE, CAPILLARY
Glucose-Capillary: 99 mg/dL (ref 70–99)
Glucose-Capillary: 131 mg/dL — ABNORMAL HIGH (ref 70–99)

## 2020-02-20 NOTE — Progress Notes (Signed)
[facility]  - [place] [facility]   PATIENT NAME: Jason Cowan    MR#:  [id number]  DATE OF BIRTH:  07/02/1970  SUBJECTIVE:   Denies any complaints. Some shortness of breath noted with exertion. Able to complete sentence without getting short of breath. Eating well. Currently sats earlier 100% on 5 L nasal cannula. REVIEW OF SYSTEMS:   Review of Systems  Constitutional: Negative for chills, fever and weight loss.  HENT: Negative for ear discharge, ear pain and nosebleeds.   Eyes: Negative for blurred vision, pain and discharge.  Respiratory: Positive for shortness of breath. Negative for sputum production, wheezing and stridor.   Cardiovascular: Negative for chest pain, palpitations, orthopnea and PND.  Gastrointestinal: Negative for abdominal pain, diarrhea, nausea and vomiting.  Genitourinary: Negative for frequency and urgency.  Musculoskeletal: Negative for back pain and joint pain.  Neurological: Negative for sensory change, speech change, focal weakness and weakness.  Psychiatric/Behavioral: Negative for depression and hallucinations. The patient is not nervous/anxious.    Tolerating Diet:yes Tolerating PT: not needed  DRUG ALLERGIES:  No Known Allergies  VITALS:  Blood pressure 100/71, pulse 79, temperature 97.9 F (36.6 C), temperature source Oral, resp. rate 19, height 5\' 6"  (1.676 m), weight 61.3 kg, SpO2 92 %.  PHYSICAL EXAMINATION:   Physical Exam  GENERAL:  50 y.o.-year-old patient lying in the bed with no acute distress.  HEENT: Head atraumatic, normocephalic. Oropharynx and nasopharynx clear.  NECK:  Supple, no jugular venous distention. No thyroid enlargement, no tenderness.  LUNGS: Normal breath sounds bilaterally, no wheezing, rales, rhonchi. No use of accessory muscles of respiration.  CARDIOVASCULAR: S1, S2 normal. No murmurs, rubs, or gallops.  ABDOMEN: Soft, nontender, nondistended. Bowel sounds present. No organomegaly or mass.   EXTREMITIES: No cyanosis, clubbing or edema b/l.    NEUROLOGIC: Cranial nerves II through XII are intact. No focal Motor or sensory deficits b/l.   PSYCHIATRIC:  patient is alert and oriented x 3.  SKIN: No obvious rash, lesion, or ulcer.   LABORATORY PANEL:  CBC Recent Labs  Lab 02/18/20 0244  WBC 6.5  HGB 13.4  HCT 41.5  PLT 240    Chemistries  Recent Labs  Lab 02/14/20 0119 02/15/20 0520  NA 141 136  K 4.5 5.0  CL 102 100  CO2 29 26  GLUCOSE 142* 116*  BUN 25* 22*  CREATININE 1.32* 0.97  CALCIUM 8.7* 8.7*  AST 19  --   ALT 39  --   ALKPHOS 52  --   BILITOT 0.8  --    Cardiac Enzymes No results for input(s): TROPONINI in the last 168 hours. RADIOLOGY:  No results found. ASSESSMENT AND PLAN:  Jason Cowan is a 50 y.o. male with medical history significant for borderline diabetes mellitus, hypertension, recent hospitalization for COVID-19 pneumonia from 02/01/2020 through 02/13/2020 and was discharged home on 2 L/min oxygen, presented to the hospital with worsening shortness of breath within 24 hours of discharge from the hospital. He was found to have acute pulmonary embolism and a small pneumomediastinum.  He was treated with Lovenox and was transitioned to Eliquis    Acute right upper lobe and left lower lobe segmental pulmonary embolism -- patient was initially treated with Lovenox now switch to PO eliquis -- wean oxygen to keep sats greater than 85% -- patient was on 5 L nasal cannula earlier with hundred percent sats.  Recent COVID pneumonia infection -- completed treatment with steroid and remdesivir  Hypertension-- not  on any meds blood pressure stable  Type II diabetes without complication -- hemoglobin A1c 6.3. Patient recommended diet and exercise. Not on any oral meds -- sugar checks have been overall stable  Procedures: Family communication : Consults : none CODE STATUS: full DVT Prophylaxis : Level of care: Med-Surg Status is:  Inpatient  Remains inpatient appropriate because:Inpatient level of care appropriate due to severity of illness   Dispo: The patient is from: Home              Anticipated d/c is to: Home              Anticipated d/c date is: 1 day              Patient currently is medically stable to d/c.   Difficult to place patient No patient needs to be weaned down to nasal cannula between 4 and 5 L. Likely discharge tomorrow. Overall he is improving.       TOTAL TIME TAKING CARE OF THIS PATIENT: 25 minutes.  >50% time spent on counselling and coordination of care  Note: This dictation was prepared with Dragon dictation along with smaller phrase technology. Any transcriptional errors that result from this process are unintentional.  Enedina Finner M.D    [facility]   CC: Primary care physician; Patient, No Pcp PerPatient ID: Jason Cowan, male   DOB: [birth year], 50 y.o.   MRN: [id number]

## 2020-02-20 NOTE — Progress Notes (Addendum)
[facility]  - [place] at [facility]   PATIENT NAME: Jason Cowan    MR#:  [id number]  DATE OF BIRTH:  December 18, 1970  SUBJECTIVE:   Denies any complaints. Some shortness of breath noted with exertion. Able to complete sentence without getting short of breath. Eating well. Currently sats earlier 100% on 5 L nasal cannula. REVIEW OF SYSTEMS:   Review of Systems  Constitutional: Negative for chills, fever and weight loss.  HENT: Negative for ear discharge, ear pain and nosebleeds.   Eyes: Negative for blurred vision, pain and discharge.  Respiratory: Positive for shortness of breath. Negative for sputum production, wheezing and stridor.   Cardiovascular: Negative for chest pain, palpitations, orthopnea and PND.  Gastrointestinal: Negative for abdominal pain, diarrhea, nausea and vomiting.  Genitourinary: Negative for frequency and urgency.  Musculoskeletal: Negative for back pain and joint pain.  Neurological: Negative for sensory change, speech change, focal weakness and weakness.  Psychiatric/Behavioral: Negative for depression and hallucinations. The patient is not nervous/anxious.    Tolerating Diet:yes Tolerating PT: not needed  DRUG ALLERGIES:  No Known Allergies  VITALS:  Blood pressure 100/71, pulse 79, temperature 97.9 F (36.6 C), temperature source Oral, resp. rate 19, height 5\' 6"  (1.676 m), weight 61.3 kg, SpO2 94 %.  PHYSICAL EXAMINATION:   Physical Exam  GENERAL:  50 y.o.-year-old patient lying in the bed with no acute distress.  HEENT: Head atraumatic, normocephalic. Oropharynx and nasopharynx clear.  NECK:  Supple, no jugular venous distention. No thyroid enlargement, no tenderness.  LUNGS: Normal breath sounds bilaterally, no wheezing, rales, rhonchi. No use of accessory muscles of respiration.  CARDIOVASCULAR: S1, S2 normal. No murmurs, rubs, or gallops.  ABDOMEN: Soft, nontender, nondistended. Bowel sounds present. No organomegaly or mass.   EXTREMITIES: No cyanosis, clubbing or edema b/l.    NEUROLOGIC: Cranial nerves II through XII are intact. No focal Motor or sensory deficits b/l.   PSYCHIATRIC:  patient is alert and oriented x 3.  SKIN: No obvious rash, lesion, or ulcer.   LABORATORY PANEL:  CBC Recent Labs  Lab 02/18/20 0244  WBC 6.5  HGB 13.4  HCT 41.5  PLT 240    Chemistries  Recent Labs  Lab 02/14/20 0119 02/15/20 0520  NA 141 136  K 4.5 5.0  CL 102 100  CO2 29 26  GLUCOSE 142* 116*  BUN 25* 22*  CREATININE 1.32* 0.97  CALCIUM 8.7* 8.7*  AST 19  --   ALT 39  --   ALKPHOS 52  --   BILITOT 0.8  --    Cardiac Enzymes No results for input(s): TROPONINI in the last 168 hours. RADIOLOGY:  No results found. ASSESSMENT AND PLAN:  [name] is a 50 y.o. male with medical history significant for borderline diabetes mellitus, hypertension, recent hospitalization for COVID-19 pneumonia from 02/01/2020 through 02/13/2020 and was discharged home on 2 L/min oxygen, presented to the hospital with worsening shortness of breath within 24 hours of discharge from the hospital. He was found to have acute pulmonary embolism and a small pneumomediastinum.  He was treated with Lovenox and was transitioned to Eliquis    Acute right upper lobe and left lower lobe segmental pulmonary embolism -- patient was initially treated with Lovenox now switch to PO eliquis -- wean oxygen to keep sats greater than 85% -- patient was on 5 L nasal cannula earlier with hundred percent sats. --addendum: sats 94% on 3liter [place] this afternoon  Recent COVID pneumonia infection -- completed  treatment with steroid and remdesivir  Hypertension-- not on any meds blood pressure stable  Type II diabetes without complication -- hemoglobin A1c 6.3. Patient recommended diet and exercise. Not on any oral meds -- sugar checks have been overall stable  Procedures: Family communication : Consults : none CODE STATUS: full DVT  Prophylaxis : Level of care: Med-Surg Status is: Inpatient  Remains inpatient appropriate because:Inpatient level of care appropriate due to severity of illness   Dispo: The patient is from: Home              Anticipated d/c is to: Home              Anticipated d/c date is: 1 day              Patient currently is medically stable to d/c.   Difficult to place patient No patient needs to be weaned down to nasal cannula between 4 and 5 L. Likely discharge tomorrow. Overall he is improving.       TOTAL TIME TAKING CARE OF THIS PATIENT: 25 minutes.  >50% time spent on counselling and coordination of care  Note: This dictation was prepared with Dragon dictation along with smaller phrase technology. Any transcriptional errors that result from this process are unintentional.  Enedina Finner M.D    [facility]   CC: Primary care physician; Patient, No Pcp PerPatient ID: Jason Cowan, male   DOB: 25-Sep-1970, 50 y.o.   MRN: [id number]

## 2020-02-21 LAB — GLUCOSE, CAPILLARY
Glucose-Capillary: 105 mg/dL — ABNORMAL HIGH (ref 70–99)
Glucose-Capillary: 117 mg/dL — ABNORMAL HIGH (ref 70–99)

## 2020-02-21 MED ORDER — IPRATROPIUM-ALBUTEROL 0.5-2.5 (3) MG/3ML IN SOLN: 3.0000 mL | Freq: Four times a day (QID) | RESPIRATORY_TRACT | 0 refills | Status: DC | PRN

## 2020-02-21 MED ORDER — IPRATROPIUM-ALBUTEROL 0.5-2.5 (3) MG/3ML IN SOLN: 3.0000 mL | Freq: Four times a day (QID) | RESPIRATORY_TRACT | Status: DC | PRN

## 2020-02-21 MED ORDER — APIXABAN 5 MG PO TABS: 10.0000 mg | ORAL_TABLET | Freq: Two times a day (BID) | ORAL | 3 refills | Status: DC

## 2020-02-21 MED ORDER — FLUTICASONE PROPIONATE HFA 110 MCG/ACT IN AERO: 2.0000 | INHALATION_SPRAY | Freq: Two times a day (BID) | RESPIRATORY_TRACT | 2 refills | Status: DC

## 2020-02-21 MED ORDER — ALPRAZOLAM 0.25 MG PO TABS: 0.2500 mg | ORAL_TABLET | Freq: Two times a day (BID) | ORAL | 0 refills | Status: DC | PRN

## 2020-02-21 MED ORDER — ALBUTEROL SULFATE HFA 108 (90 BASE) MCG/ACT IN AERS: 2.0000 | INHALATION_SPRAY | Freq: Four times a day (QID) | RESPIRATORY_TRACT | 2 refills | Status: DC | PRN

## 2020-02-21 NOTE — Progress Notes (Signed)
MD Allena Katz placed discharge orders for patient. This RN contacted Case management to arrange ride home for patient with knowledge of 3 oxygen tanks to go home with patient as well. Patient education complete at this time as well as provided with Eliquis coupon in discharge packet. Patient will be discharged when accommodations arranged.

## 2020-02-21 NOTE — Discharge Instructions (Signed)
Use your oxygen, flutter valve, inhaler and nebs as directed

## 2020-02-21 NOTE — Discharge Summary (Signed)
[facility] - [place] at [facility]   PATIENT NAME: Jason Cowan    MR#:  [id number]  DATE OF BIRTH:  1970-04-11  DATE OF ADMISSION:  02/14/2020 ADMITTING PHYSICIAN: Lucile Shutters, MD  DATE OF DISCHARGE: 02/21/2020  PRIMARY CARE PHYSICIAN: Patient, No Pcp Per    ADMISSION DIAGNOSIS:  SOB (shortness of breath) [R06.02] COPD exacerbation (HCC) [J44.1] Acute pulmonary embolism (HCC) [I26.99] Acute respiratory failure with hypoxia (HCC) [J96.01]  DISCHARGE DIAGNOSIS:  Acute Hypoxic respiratory failure due to PE and recent COVID-19 pneumonia  SECONDARY DIAGNOSIS:   Past Medical History:  Diagnosis Date  . Hypertension     HOSPITAL COURSE:  [name] a 49 y.o.malewith medical history significant for borderline diabetes mellitus, hypertension, recent hospitalization for COVID-19 pneumonia from 02/01/2020 through 02/13/2020 and was discharged home on 2 L/minoxygen, presented to the hospital with worsening shortness of breath within 24 hours of discharge from the hospital. He was found to have acute pulmonary embolism and a small pneumomediastinum. He was treated with Lovenox and was transitioned to Eliquis  Acute right upper lobe and left lower lobe segmental pulmonary embolism -- patient was initially treated with Lovenox now switched to PO eliquis --addendum: sats 94--100 % on 3 liter   Recent COVID pneumonia infection -- completed treatment with steroid and remdesivir  Hypertension-- not on any meds blood pressure stable  Type II diabetes without complication -- hemoglobin A1c 6.3. Patient recommended diet and exercise. Not on any oral meds--has rx for metformin -- sugar checks have been overall stable  Procedures: Family communication :tina jenkins on 1/26 Consults : none CODE STATUS: full DVT Prophylaxis : Level of care: Med-Surg Status is: Inpatient   Dispo: The patient is from: Home  Anticipated d/c is to:  Home  Anticipated d/c date YH:CWCBJ  Patient currently is medically stable to d/c.              Difficult to place patient No Overall he is improving. Discussed importance of COIVD vaccine. Advised to get PCP in the area Will try get appt with [facility] pulmonary for f/u.  CONSULTS OBTAINED:    DRUG ALLERGIES:  No Known Allergies  DISCHARGE MEDICATIONS:   Allergies as of 02/21/2020   No Known Allergies     Medication List    STOP taking these medications   predniSONE 20 MG tablet Commonly known as: DELTASONE     TAKE these medications   albuterol 108 (90 Base) MCG/ACT inhaler Commonly known as: VENTOLIN HFA Inhale 2 puffs into the lungs every 6 (six) hours as needed for wheezing or shortness of breath.   ALPRAZolam 0.25 MG tablet Commonly known as: Xanax Take 1 tablet (0.25 mg total) by mouth 2 (two) times daily as needed for anxiety.   apixaban 5 MG Tabs tablet Commonly known as: ELIQUIS Take 2 tablets (10 mg total) by mouth 2 (two) times daily. And 5 mg (1 tab) two times a day from 02/23/2020   fluticasone 110 MCG/ACT inhaler Commonly known as: FLOVENT HFA Inhale 2 puffs into the lungs 2 (two) times daily.   ipratropium-albuterol 0.5-2.5 (3) MG/3ML Soln Commonly known as: DUONEB Take 3 mLs by nebulization every 6 (six) hours as needed. Start taking on: February 22, 2020   metFORMIN 500 MG tablet Commonly known as: Glucophage Take 1 tablet (500 mg total) by mouth 2 (two) times daily with a meal.   multivitamin with minerals Tabs tablet Take 1 tablet by mouth daily.  Durable Medical Equipment  (From admission, onward)         Start     Ordered   02/20/20 1441  For home use only DME oxygen  Once       Question Answer Comment  Length of Need 6 Months   Mode or (Route) Nasal cannula   Liters per Minute 3.5   Frequency Continuous (stationary and portable oxygen unit needed)   Oxygen conserving device Yes   Oxygen delivery  system Gas      02/20/20 1440   02/20/20 1437  For home use only DME Nebulizer machine  Once       Question Answer Comment  Patient needs a nebulizer to treat with the following condition PE (pulmonary thromboembolism) (HCC)   Length of Need 6 Months      02/20/20 1436          If you experience worsening of your admission symptoms, develop shortness of breath, life threatening emergency, suicidal or homicidal thoughts you must seek medical attention immediately by calling 911 or calling your MD immediately  if symptoms less severe.  You Must read complete instructions/literature along with all the possible adverse reactions/side effects for all the Medicines you take and that have been prescribed to you. Take any new Medicines after you have completely understood and accept all the possible adverse reactions/side effects.   Please note  You were cared for by a hospitalist during your hospital stay. If you have any questions about your discharge medications or the care you received while you were in the hospital after you are discharged, you can call the unit and asked to speak with the hospitalist on call if the hospitalist that took care of you is not available. Once you are discharged, your primary care physician will handle any further medical issues. Please note that NO REFILLS for any discharge medications will be authorized once you are discharged, as it is imperative that you return to your primary care physician (or establish a relationship with a primary care physician if you do not have one) for your aftercare needs so that they can reassess your need for medications and monitor your lab values. Today   SUBJECTIVE   No new complaints. Speaking full sentences w/o sob Some mild exertional SOB (expected)  VITAL SIGNS:  Blood pressure 133/89, pulse 76, temperature 98.3 F (36.8 C), temperature source Oral, resp. rate 17, height 5\' 6"  (1.676 m), weight 61.1 kg, SpO2 99 %.  I/O:     Intake/Output Summary (Last 24 hours) at 02/21/2020 0818 Last data filed at 02/21/2020 0019 Gross per 24 hour  Intake 960 ml  Output 2150 ml  Net -1190 ml    PHYSICAL EXAMINATION:  GENERAL:  50 y.o.-year-old patient lying in the bed with no acute distress.  EYES: Pupils equal, round, reactive to light and accommodation. No scleral icterus.  HEENT: Head atraumatic, normocephalic. Oropharynx and nasopharynx clear.  NECK:  Supple, no jugular venous distention. No thyroid enlargement, no tenderness.  LUNGS: Normal breath sounds bilaterally, no wheezing, rales,rhonchi or crepitation. No use of accessory muscles of respiration.  CARDIOVASCULAR: S1, S2 normal. No murmurs, rubs, or gallops.  ABDOMEN: Soft, non-tender, non-distended. Bowel sounds present. No organomegaly or mass.  EXTREMITIES: No pedal edema, cyanosis, or clubbing.  NEUROLOGIC: Cranial nerves II through XII are intact. Muscle strength 5/5 in all extremities. Sensation intact. Gait not checked.  PSYCHIATRIC: The patient is alert and oriented x 3.  SKIN: No obvious rash, lesion, or  ulcer.   DATA REVIEW:   CBC  Recent Labs  Lab 02/18/20 0244  WBC 6.5  HGB 13.4  HCT 41.5  PLT 240    Chemistries  Recent Labs  Lab 02/15/20 0520  NA 136  K 5.0  CL 100  CO2 26  GLUCOSE 116*  BUN 22*  CREATININE 0.97  CALCIUM 8.7*    Microbiology Results   No results found for this or any previous visit (from the past 240 hour(s)).  RADIOLOGY:  No results found.   CODE STATUS:     Code Status Orders  (From admission, onward)         Start     Ordered   02/14/20 1121  Full code  Continuous        02/14/20 1122        Code Status History    Date Active Date Inactive Code Status Order ID Comments User Context   02/01/2020 1506 02/13/2020 0018 Full Code [id number]  Kathrynn Running, MD ED   Advance Care Planning Activity       TOTAL TIME TAKING CARE OF THIS PATIENT: *35* minutes.    Enedina Finner M.D  [facility]    CC: Primary care physician; Patient, No Pcp Per

## 2020-09-17 ENCOUNTER — Other Ambulatory Visit: Payer: Self-pay | Admitting: Pulmonary Disease

## 2020-09-17 DIAGNOSIS — J189 Pneumonia, unspecified organism: Secondary | ICD-10-CM

## 2020-09-19 ENCOUNTER — Other Ambulatory Visit: Payer: Self-pay

## 2020-09-19 ENCOUNTER — Ambulatory Visit
Admission: RE | Admit: 2020-09-19 | Discharge: 2020-09-19 | Disposition: A | Discharge status: Home / Self Care | Payer: BC Managed Care – PPO | Source: Ambulatory Visit | Attending: Pulmonary Disease | Admitting: Pulmonary Disease

## 2020-09-19 DIAGNOSIS — J189 Pneumonia, unspecified organism: Secondary | ICD-10-CM | POA: Insufficient documentation

## 2020-09-19 LAB — POCT I-STAT CREATININE: Creatinine, Ser: 1.1 mg/dL (ref 0.61–1.24)

## 2020-09-19 MED ORDER — IOHEXOL 350 MG/ML SOLN
75.0000 mL | Freq: Once | INTRAVENOUS | Status: AC | PRN
  Administered 2020-09-19: 75 mL via INTRAVENOUS

## 2021-03-06 ENCOUNTER — Other Ambulatory Visit: Payer: Self-pay | Admitting: Pulmonary Disease

## 2021-03-06 DIAGNOSIS — R0602 Shortness of breath: Secondary | ICD-10-CM

## 2021-03-09 ENCOUNTER — Ambulatory Visit: Admission: RE | Admit: 2021-03-09 | Payer: BC Managed Care – PPO | Source: Ambulatory Visit

## 2021-04-07 ENCOUNTER — Other Ambulatory Visit: Payer: Self-pay

## 2021-04-07 ENCOUNTER — Ambulatory Visit
Admission: RE | Admit: 2021-04-07 | Discharge: 2021-04-07 | Disposition: A | Discharge status: Home / Self Care | Payer: Managed Care, Other (non HMO) | Source: Ambulatory Visit | Attending: Pulmonary Disease | Admitting: Pulmonary Disease

## 2021-04-07 DIAGNOSIS — R0602 Shortness of breath: Secondary | ICD-10-CM | POA: Diagnosis present

## 2021-04-07 LAB — POCT I-STAT CREATININE: Creatinine, Ser: 1.3 mg/dL — ABNORMAL HIGH (ref 0.61–1.24)

## 2021-04-07 MED ORDER — IOHEXOL 350 MG/ML SOLN
75.0000 mL | Freq: Once | INTRAVENOUS | Status: AC | PRN
  Administered 2021-04-07: 75 mL via INTRAVENOUS

## 2023-09-01 ENCOUNTER — Ambulatory Visit: Admitting: Cardiology

## 2023-09-09 ENCOUNTER — Ambulatory Visit: Admitting: Cardiology

## 2023-09-09 ENCOUNTER — Encounter: Payer: Self-pay | Admitting: Cardiology

## 2023-09-09 VITALS — BP 164/104 | HR 100 | Ht 66.0 in | Wt 200.6 lb

## 2023-09-09 DIAGNOSIS — E119 Type 2 diabetes mellitus without complications: Secondary | ICD-10-CM | POA: Diagnosis not present

## 2023-09-09 DIAGNOSIS — Z1329 Encounter for screening for other suspected endocrine disorder: Secondary | ICD-10-CM

## 2023-09-09 DIAGNOSIS — I10 Essential (primary) hypertension: Secondary | ICD-10-CM | POA: Diagnosis not present

## 2023-09-09 DIAGNOSIS — Z1211 Encounter for screening for malignant neoplasm of colon: Secondary | ICD-10-CM

## 2023-09-09 DIAGNOSIS — Z7689 Persons encountering health services in other specified circumstances: Secondary | ICD-10-CM | POA: Insufficient documentation

## 2023-09-09 DIAGNOSIS — Z125 Encounter for screening for malignant neoplasm of prostate: Secondary | ICD-10-CM

## 2023-09-09 DIAGNOSIS — Z13220 Encounter for screening for lipoid disorders: Secondary | ICD-10-CM

## 2023-09-09 DIAGNOSIS — R002 Palpitations: Secondary | ICD-10-CM | POA: Diagnosis not present

## 2023-09-09 MED ORDER — LOSARTAN POTASSIUM 25 MG PO TABS: 25.0000 mg | ORAL_TABLET | Freq: Every day | ORAL | 1 refills | Status: DC

## 2023-09-09 NOTE — Progress Notes (Signed)
 Established Patient Office Visit  Subjective:  Patient ID: Jason Cowan, male    DOB: 1970/04/28  Age: 53 y.o. MRN: [id number]  Chief Complaint  Patient presents with   Establish Care    NPE    Patient in office to establish care. Patient reports feeling well overall. 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 Blood pressure elevated today. Will start patient on losartan .  Patient not currently on any medications. Will get fasting lab work prior to restarting diabetes medication.  Patient due for colon cancer screening. Referral sent to GI. Patient requesting a stress test, will refer to cardiology.     No other concerns at this time.   Past Medical History:  Diagnosis Date   Hypertension     History reviewed. No pertinent surgical history.  Social History   Socioeconomic History   Marital status: Married    Spouse name: Not on file   Number of children: Not on file   Years of education: Not on file   Highest education level: Not on file  Occupational History   Not on file  Tobacco Use   Smoking status: Every Day    Types: Cigars   Smokeless tobacco: Never  Substance and Sexual Activity   Alcohol use: Yes   Drug use: Not Currently   Sexual activity: Not on file  Other Topics Concern   Not on file  Social History Narrative   Not on file   Social Drivers of Health   Financial Resource Strain: Not on file  Food Insecurity: Not on file  Transportation Needs: Not on file  Physical Activity: Not on file  Stress: Not on file  Social Connections: Not on file  Intimate Partner Violence: Not on file    History reviewed. No pertinent family history.  Allergies  Allergen Reactions   Penicillins Itching    Outpatient Medications Prior to Visit  Medication Sig   [DISCONTINUED] albuterol  (VENTOLIN  HFA) 108 (90 Base) MCG/ACT inhaler Inhale 2 puffs into the lungs every 6 (six) hours as needed for wheezing or shortness of breath.  (Patient not taking: Reported on 09/09/2023)   [DISCONTINUED] ALPRAZolam  (XANAX ) 0.25 MG tablet Take 1 tablet (0.25 mg total) by mouth 2 (two) times daily as needed for anxiety. (Patient not taking: Reported on 09/09/2023)   [DISCONTINUED] apixaban  (ELIQUIS ) 5 MG TABS tablet Take 2 tablets (10 mg total) by mouth 2 (two) times daily. And 5 mg (1 tab) two times a day from 02/23/2020 (Patient not taking: Reported on 09/09/2023)   [DISCONTINUED] fluticasone  (FLOVENT  HFA) 110 MCG/ACT inhaler Inhale 2 puffs into the lungs 2 (two) times daily. (Patient not taking: Reported on 09/09/2023)   [DISCONTINUED] ipratropium-albuterol  (DUONEB) 0.5-2.5 (3) MG/3ML SOLN Take 3 mLs by nebulization every 6 (six) hours as needed. (Patient not taking: Reported on 09/09/2023)   [DISCONTINUED] metFORMIN  (GLUCOPHAGE ) 500 MG tablet Take 1 tablet (500 mg total) by mouth 2 (two) times daily with a meal. (Patient not taking: Reported on 09/09/2023)   [DISCONTINUED] Multiple Vitamin (MULTIVITAMIN WITH MINERALS) TABS tablet Take 1 tablet by mouth daily. (Patient not taking: Reported on 09/09/2023)   No facility-administered medications prior to visit.    Review of Systems  Constitutional: Negative.   HENT: Negative.    Eyes: Negative.   Respiratory: Negative.  Negative for shortness of breath.   Cardiovascular: Negative.  Negative for chest pain.  Gastrointestinal: Negative.  Negative for abdominal pain, constipation and diarrhea.  Genitourinary: Negative.  Musculoskeletal:  Negative for joint pain and myalgias.  Skin: Negative.   Neurological: Negative.  Negative for dizziness and headaches.  Endo/Heme/Allergies: Negative.   All other systems reviewed and are negative.      Objective:   BP (!) 164/104 (BP Location: Right Arm, Patient Position: Sitting, Cuff Size: Large)   Pulse 100   Ht 5' 6 (1.676 m)   Wt 200 lb 9.6 oz (91 kg)   SpO2 97%   BMI 32.38 kg/m   Vitals:   09/09/23 1257 09/09/23 1333  BP: (!) 180/115  (!) 164/104  Pulse: 100   Height: 5' 6 (1.676 m)   Weight: 200 lb 9.6 oz (91 kg)   SpO2: 97%   BMI (Calculated): 32.39     Physical Exam Nursing note reviewed.  Constitutional:      Appearance: Normal appearance. He is normal weight.  HENT:     Head: Normocephalic and atraumatic.     Nose: Nose normal.     Mouth/Throat:     Mouth: Mucous membranes are moist.     Pharynx: Oropharynx is clear.  Eyes:     Extraocular Movements: Extraocular movements intact.     Conjunctiva/sclera: Conjunctivae normal.     Pupils: Pupils are equal, round, and reactive to light.  Cardiovascular:     Rate and Rhythm: Normal rate and regular rhythm.     Pulses: Normal pulses.     Heart sounds: Normal heart sounds.  Pulmonary:     Effort: Pulmonary effort is normal.     Breath sounds: Normal breath sounds.  Abdominal:     General: Abdomen is flat. Bowel sounds are normal.     Palpations: Abdomen is soft.  Musculoskeletal:        General: Normal range of motion.     Cervical back: Normal range of motion.  Skin:    General: Skin is warm and dry.  Neurological:     General: No focal deficit present.     Mental Status: He is alert and oriented to person, place, and time.  Psychiatric:        Mood and Affect: Mood normal.        Behavior: Behavior normal.        Thought Content: Thought content normal.        Judgment: Judgment normal.      No results found for any visits on 09/09/23.  No results found for this or any previous visit (from the past 2160 hours).    Assessment & Plan:  Start losartan  Return for fasting lab work Referral sent to GI for colonoscopy Referral sent to cardiology  Problem List Items Addressed This Visit       Cardiovascular and Mediastinum   HTN (hypertension) - Primary   Relevant Medications   losartan  (COZAAR ) 25 MG tablet   Other Relevant Orders   CMP14+EGFR   Ambulatory referral to Cardiology     Endocrine   Type 2 diabetes mellitus without  complication (HCC)   Relevant Medications   losartan  (COZAAR ) 25 MG tablet   Other Relevant Orders   CMP14+EGFR   Hemoglobin A1c     Other   Encounter to establish care   Other Visit Diagnoses       Thyroid disorder screening       Relevant Orders   TSH     Lipid screening       Relevant Orders   Lipid panel     Colon cancer screening  Relevant Orders   Ambulatory referral to Gastroenterology     Prostate cancer screening       Relevant Orders   PSA     Palpitations       Relevant Orders   Ambulatory referral to Cardiology       Return in about 4 weeks (around 10/07/2023).   Total time spent: 25 minutes  Google, NP  09/09/2023   This document may have been prepared by Dragon Voice Recognition software and as such may include unintentional dictation errors.

## 2023-09-13 ENCOUNTER — Telehealth: Payer: Self-pay

## 2023-09-13 ENCOUNTER — Other Ambulatory Visit: Payer: Self-pay

## 2023-09-13 DIAGNOSIS — Z1211 Encounter for screening for malignant neoplasm of colon: Secondary | ICD-10-CM

## 2023-09-13 MED ORDER — NA SULFATE-K SULFATE-MG SULF 17.5-3.13-1.6 GM/177ML PO SOLN
1.0000 | Freq: Once | ORAL | 0 refills | Status: AC
Start: 2023-09-13 — End: 2023-09-13

## 2023-09-13 NOTE — Telephone Encounter (Signed)
 Gastroenterology Pre-Procedure Review  Request Date: 12/20/23 Requesting Physician: Dr. Jinny  PATIENT REVIEW QUESTIONS: The patient responded to the following health history questions as indicated:    Note: Discuss with patient the process of polyp removal if polyp is present.  1. Are you having any GI issues? no 2. Do you have a personal history of Polyps? no 3. Do you have a family history of Colon Cancer or Polyps? yes (paternal grandfather colon cancer) 4. Diabetes Mellitus? no 5. Joint replacements in the past 12 months?no 6. Major health problems in the past 3 months?no 7. Any artificial heart valves, MVP, or defibrillator?no    MEDICATIONS & ALLERGIES:    Patient reports the following regarding taking any anticoagulation/antiplatelet therapy:   Plavix, Coumadin, Eliquis , Xarelto, Lovenox , Pradaxa, Brilinta, or Effient? no Aspirin? no  Patient confirms/reports the following medications:  Current Outpatient Medications  Medication Sig Dispense Refill   Na Sulfate-K Sulfate-Mg Sulfate concentrate (SUPREP) 17.5-3.13-1.6 GM/177ML SOLN Take 1 kit (354 mLs total) by mouth once for 1 dose. 354 mL 0   losartan  (COZAAR ) 25 MG tablet Take 1 tablet (25 mg total) by mouth daily. 30 tablet 1   No current facility-administered medications for this visit.    Patient confirms/reports the following allergies:  Allergies  Allergen Reactions   Penicillins Itching    No orders of the defined types were placed in this encounter.   AUTHORIZATION INFORMATION Primary Insurance: 1D#: Group #:  Secondary Insurance: 1D#: Group #:  SCHEDULE INFORMATION: Date: 12/20/23 Time: Location: [facility]

## 2023-09-14 ENCOUNTER — Other Ambulatory Visit

## 2023-09-14 DIAGNOSIS — I10 Essential (primary) hypertension: Secondary | ICD-10-CM

## 2023-09-14 DIAGNOSIS — Z125 Encounter for screening for malignant neoplasm of prostate: Secondary | ICD-10-CM

## 2023-09-14 DIAGNOSIS — Z13220 Encounter for screening for lipoid disorders: Secondary | ICD-10-CM

## 2023-09-14 DIAGNOSIS — E119 Type 2 diabetes mellitus without complications: Secondary | ICD-10-CM

## 2023-09-14 DIAGNOSIS — Z1329 Encounter for screening for other suspected endocrine disorder: Secondary | ICD-10-CM

## 2023-09-15 ENCOUNTER — Encounter: Payer: Self-pay | Admitting: Cardiovascular Disease

## 2023-09-15 ENCOUNTER — Ambulatory Visit: Payer: Self-pay | Admitting: Cardiology

## 2023-09-15 ENCOUNTER — Ambulatory Visit: Admitting: Cardiovascular Disease

## 2023-09-15 VITALS — BP 160/80 | HR 85 | Ht 66.0 in | Wt 199.4 lb

## 2023-09-15 DIAGNOSIS — I10 Essential (primary) hypertension: Secondary | ICD-10-CM

## 2023-09-15 DIAGNOSIS — R9431 Abnormal electrocardiogram [ECG] [EKG]: Secondary | ICD-10-CM | POA: Diagnosis not present

## 2023-09-15 DIAGNOSIS — R002 Palpitations: Secondary | ICD-10-CM

## 2023-09-15 DIAGNOSIS — R55 Syncope and collapse: Secondary | ICD-10-CM

## 2023-09-15 DIAGNOSIS — E119 Type 2 diabetes mellitus without complications: Secondary | ICD-10-CM | POA: Diagnosis not present

## 2023-09-15 DIAGNOSIS — R42 Dizziness and giddiness: Secondary | ICD-10-CM

## 2023-09-15 LAB — LIPID PANEL
Chol/HDL Ratio: 3.9 ratio (ref 0.0–5.0)
Cholesterol, Total: 238 mg/dL — ABNORMAL HIGH (ref 100–199)
HDL: 61 mg/dL (ref >39)
LDL Chol Calc (NIH): 162 mg/dL — ABNORMAL HIGH (ref 0–99)
Triglycerides: 85 mg/dL (ref 0–149)
VLDL Cholesterol Cal: 15 mg/dL (ref 5–40)

## 2023-09-15 LAB — CMP14+EGFR
ALT: 20 IU/L (ref 0–44)
AST: 18 IU/L (ref 0–40)
Albumin: 4.5 g/dL (ref 3.8–4.9)
Alkaline Phosphatase: 88 IU/L (ref 44–121)
BUN/Creatinine Ratio: 13 (ref 9–20)
BUN: 15 mg/dL (ref 6–24)
Bilirubin Total: 0.3 mg/dL (ref 0.0–1.2)
CO2: 21 mmol/L (ref 20–29)
Calcium: 9.7 mg/dL (ref 8.7–10.2)
Chloride: 102 mmol/L (ref 96–106)
Creatinine, Ser: 1.16 mg/dL (ref 0.76–1.27)
Globulin, Total: 2.9 g/dL (ref 1.5–4.5)
Glucose: 98 mg/dL (ref 70–99)
Potassium: 4.4 mmol/L (ref 3.5–5.2)
Sodium: 141 mmol/L (ref 134–144)
Total Protein: 7.4 g/dL (ref 6.0–8.5)
eGFR: 75 mL/min/1.73 (ref >59)

## 2023-09-15 LAB — PSA: Prostate Specific Ag, Serum: 1.4 ng/mL (ref 0.0–4.0)

## 2023-09-15 LAB — HEMOGLOBIN A1C
Est. average glucose Bld gHb Est-mCnc: 126 mg/dL
Hgb A1c MFr Bld: 6 % — ABNORMAL HIGH (ref 4.8–5.6)

## 2023-09-15 LAB — TSH: TSH: 2.06 u[IU]/mL (ref 0.450–4.500)

## 2023-09-15 MED ORDER — LOSARTAN POTASSIUM-HCTZ 50-12.5 MG PO TABS: 1.0000 | ORAL_TABLET | Freq: Every day | ORAL | 11 refills | Status: DC

## 2023-09-15 NOTE — Progress Notes (Signed)
 Cardiology Office Note   Date:  09/15/2023   ID:  Jason Cowan, DOB [birth year], MRN [id number]  PCP:  Patient, No Pcp Per  Cardiologist:  Denyse Bathe, MD      History of Present Illness: Jason Cowan is a 53 y.o. male who presents for  Chief Complaint  Patient presents with   Consult    Cardiac Consult    89 YOAM, presents with dizziness and feeling of passing out, last summer and told had irregular heart beat, during DOT physical.      Past Medical History:  Diagnosis Date   Hypertension      History reviewed. No pertinent surgical history.   Current Outpatient Medications  Medication Sig Dispense Refill   losartan -hydrochlorothiazide (HYZAAR) 50-12.5 MG tablet Take 1 tablet by mouth daily. 30 tablet 11   No current facility-administered medications for this visit.    Allergies:   Penicillins    Social History:   reports that he has been smoking cigars. He has never used smokeless tobacco. He reports current alcohol use. He reports that he does not currently use drugs.   Family History:  family history is not on file.    ROS:     Review of Systems  Constitutional: Negative.   HENT: Negative.    Eyes: Negative.   Respiratory: Negative.    Gastrointestinal: Negative.   Genitourinary: Negative.   Musculoskeletal: Negative.   Skin: Negative.   Neurological: Negative.   Endo/Heme/Allergies: Negative.   Psychiatric/Behavioral: Negative.    All other systems reviewed and are negative.     All other systems are reviewed and negative.    PHYSICAL EXAM: VS:  BP (!) 160/80   Pulse 85   Ht 5' 6 (1.676 m)   Wt 199 lb 6.4 oz (90.4 kg)   SpO2 96%   BMI 32.18 kg/m  , BMI Body mass index is 32.18 kg/m. Last weight:  Wt Readings from Last 3 Encounters:  09/15/23 199 lb 6.4 oz (90.4 kg)  09/09/23 200 lb 9.6 oz (91 kg)  02/21/20 134 lb 11.2 oz (61.1 kg)     Physical Exam Vitals reviewed.  Constitutional:      Appearance: Normal  appearance. He is normal weight.  HENT:     Head: Normocephalic.     Nose: Nose normal.     Mouth/Throat:     Mouth: Mucous membranes are moist.  Eyes:     Pupils: Pupils are equal, round, and reactive to light.  Cardiovascular:     Rate and Rhythm: Normal rate and regular rhythm.     Pulses: Normal pulses.     Heart sounds: Normal heart sounds.  Pulmonary:     Effort: Pulmonary effort is normal.  Abdominal:     General: Abdomen is flat. Bowel sounds are normal.  Musculoskeletal:        General: Normal range of motion.     Cervical back: Normal range of motion.  Skin:    General: Skin is warm.  Neurological:     General: No focal deficit present.     Mental Status: He is alert.  Psychiatric:        Mood and Affect: Mood normal.       EKG: NSR 67/min  pvcs, prwp, non specific st changes  Recent Labs: 09/14/2023: ALT 20; BUN 15; Creatinine, Ser 1.16; Potassium 4.4; Sodium 141; TSH 2.060    Lipid Panel    Component Value Date/Time   CHOL 238 (  H) 09/14/2023 1023   TRIG 85 09/14/2023 1023   HDL 61 09/14/2023 1023   CHOLHDL 3.9 09/14/2023 1023   LDLCALC 162 (H) 09/14/2023 1023      Other studies Reviewed: Additional studies/ records that were reviewed today include:  Review of the above records demonstrates:       No data to display            ASSESSMENT AND PLAN:    ICD-10-CM   1. Primary hypertension  I10 PCV ECHOCARDIOGRAM COMPLETE    MYOCARDIAL PERFUSION IMAGING    losartan -hydrochlorothiazide (HYZAAR) 50-12.5 MG tablet   uncontrolled. change losartan  25 to hyzaar 50/12.5    2. Type 2 diabetes mellitus without complication, without long-term current use of insulin  (HCC)  E11.9 PCV ECHOCARDIOGRAM COMPLETE    MYOCARDIAL PERFUSION IMAGING    losartan -hydrochlorothiazide (HYZAAR) 50-12.5 MG tablet    3. Palpitations  R00.2 PCV ECHOCARDIOGRAM COMPLETE    MYOCARDIAL PERFUSION IMAGING    losartan -hydrochlorothiazide (HYZAAR) 50-12.5 MG tablet    4.  Dizziness  R42 PCV ECHOCARDIOGRAM COMPLETE    MYOCARDIAL PERFUSION IMAGING    losartan -hydrochlorothiazide (HYZAAR) 50-12.5 MG tablet    5. Syncope, unspecified syncope type  R55 PCV ECHOCARDIOGRAM COMPLETE    MYOCARDIAL PERFUSION IMAGING    losartan -hydrochlorothiazide (HYZAAR) 50-12.5 MG tablet    6. Abnormal EKG  R94.31 PCV ECHOCARDIOGRAM COMPLETE    MYOCARDIAL PERFUSION IMAGING    losartan -hydrochlorothiazide (HYZAAR) 50-12.5 MG tablet   EKG shows NSR 67/min frequent PVCS. LVH, non specific ST changes. set up echo, stress test.       Problem List Items Addressed This Visit       Cardiovascular and Mediastinum   HTN (hypertension) - Primary   Relevant Medications   losartan -hydrochlorothiazide (HYZAAR) 50-12.5 MG tablet   Other Relevant Orders   PCV ECHOCARDIOGRAM COMPLETE   MYOCARDIAL PERFUSION IMAGING     Endocrine   Type 2 diabetes mellitus without complication (HCC)   Relevant Medications   losartan -hydrochlorothiazide (HYZAAR) 50-12.5 MG tablet   Other Relevant Orders   PCV ECHOCARDIOGRAM COMPLETE   MYOCARDIAL PERFUSION IMAGING   Other Visit Diagnoses       Palpitations       Relevant Medications   losartan -hydrochlorothiazide (HYZAAR) 50-12.5 MG tablet   Other Relevant Orders   PCV ECHOCARDIOGRAM COMPLETE   MYOCARDIAL PERFUSION IMAGING     Dizziness       Relevant Medications   losartan -hydrochlorothiazide (HYZAAR) 50-12.5 MG tablet   Other Relevant Orders   PCV ECHOCARDIOGRAM COMPLETE   MYOCARDIAL PERFUSION IMAGING     Syncope, unspecified syncope type       Relevant Medications   losartan -hydrochlorothiazide (HYZAAR) 50-12.5 MG tablet   Other Relevant Orders   PCV ECHOCARDIOGRAM COMPLETE   MYOCARDIAL PERFUSION IMAGING     Abnormal EKG       EKG shows NSR 67/min frequent PVCS. LVH, non specific ST changes. set up echo, stress test.   Relevant Medications   losartan -hydrochlorothiazide (HYZAAR) 50-12.5 MG tablet   Other Relevant Orders   PCV  ECHOCARDIOGRAM COMPLETE   MYOCARDIAL PERFUSION IMAGING          Disposition:   Return for echo, stress test and f/u.    Total time spent: 50 minutes  Signed,  Denyse Bathe, MD  09/15/2023 11:49 AM    [facility]

## 2023-09-16 ENCOUNTER — Encounter: Payer: Self-pay | Admitting: Cardiovascular Disease

## 2023-09-23 ENCOUNTER — Ambulatory Visit (INDEPENDENT_AMBULATORY_CARE_PROVIDER_SITE_OTHER)

## 2023-09-23 DIAGNOSIS — E119 Type 2 diabetes mellitus without complications: Secondary | ICD-10-CM

## 2023-09-23 DIAGNOSIS — I10 Essential (primary) hypertension: Secondary | ICD-10-CM

## 2023-09-23 DIAGNOSIS — R55 Syncope and collapse: Secondary | ICD-10-CM

## 2023-09-23 DIAGNOSIS — R9431 Abnormal electrocardiogram [ECG] [EKG]: Secondary | ICD-10-CM

## 2023-09-23 DIAGNOSIS — I361 Nonrheumatic tricuspid (valve) insufficiency: Secondary | ICD-10-CM | POA: Diagnosis not present

## 2023-09-23 DIAGNOSIS — I340 Nonrheumatic mitral (valve) insufficiency: Secondary | ICD-10-CM | POA: Diagnosis not present

## 2023-09-23 DIAGNOSIS — I371 Nonrheumatic pulmonary valve insufficiency: Secondary | ICD-10-CM | POA: Diagnosis not present

## 2023-09-23 DIAGNOSIS — R002 Palpitations: Secondary | ICD-10-CM

## 2023-09-23 DIAGNOSIS — R42 Dizziness and giddiness: Secondary | ICD-10-CM

## 2023-09-29 ENCOUNTER — Ambulatory Visit (INDEPENDENT_AMBULATORY_CARE_PROVIDER_SITE_OTHER)

## 2023-09-29 DIAGNOSIS — I10 Essential (primary) hypertension: Secondary | ICD-10-CM

## 2023-09-29 DIAGNOSIS — R55 Syncope and collapse: Secondary | ICD-10-CM | POA: Diagnosis not present

## 2023-09-29 DIAGNOSIS — R9431 Abnormal electrocardiogram [ECG] [EKG]: Secondary | ICD-10-CM

## 2023-09-29 DIAGNOSIS — E119 Type 2 diabetes mellitus without complications: Secondary | ICD-10-CM

## 2023-09-29 DIAGNOSIS — R002 Palpitations: Secondary | ICD-10-CM

## 2023-09-29 DIAGNOSIS — R42 Dizziness and giddiness: Secondary | ICD-10-CM

## 2023-10-03 ENCOUNTER — Ambulatory Visit: Admitting: Cardiovascular Disease

## 2023-10-03 ENCOUNTER — Encounter: Payer: Self-pay | Admitting: Cardiovascular Disease

## 2023-10-03 VITALS — BP 124/62 | HR 91 | Ht 66.0 in | Wt 203.0 lb

## 2023-10-03 DIAGNOSIS — I10 Essential (primary) hypertension: Secondary | ICD-10-CM

## 2023-10-03 DIAGNOSIS — R55 Syncope and collapse: Secondary | ICD-10-CM | POA: Diagnosis not present

## 2023-10-03 DIAGNOSIS — E119 Type 2 diabetes mellitus without complications: Secondary | ICD-10-CM | POA: Diagnosis not present

## 2023-10-03 DIAGNOSIS — R002 Palpitations: Secondary | ICD-10-CM | POA: Diagnosis not present

## 2023-10-03 DIAGNOSIS — R9431 Abnormal electrocardiogram [ECG] [EKG]: Secondary | ICD-10-CM

## 2023-10-03 MED ORDER — DAPAGLIFLOZIN PROPANEDIOL 10 MG PO TABS: 10.0000 mg | ORAL_TABLET | Freq: Every day | ORAL | 3 refills | Status: AC

## 2023-10-03 NOTE — Progress Notes (Addendum)
 Cardiology Office Note   Date:  10/03/2023   ID:  Jason Cowan, DOB November 16, 1970, MRN [id number]  PCP:  Patient, No Pcp Per  Cardiologist:  Denyse Bathe, MD      History of Present Illness: Jason Cowan is a 53 y.o. male who presents for  Chief Complaint  Patient presents with   Follow-up    NST/echo results    Has SOB.      Past Medical History:  Diagnosis Date   Hypertension      History reviewed. No pertinent surgical history.   Current Outpatient Medications  Medication Sig Dispense Refill   dapagliflozin  propanediol (FARXIGA ) 10 MG TABS tablet Take 1 tablet (10 mg total) by mouth daily before breakfast. 30 tablet 3   losartan -hydrochlorothiazide (HYZAAR) 50-12.5 MG tablet Take 1 tablet by mouth daily. 30 tablet 11   No current facility-administered medications for this visit.    Allergies:   Penicillins    Social History:   reports that he has been smoking cigars. He has never used smokeless tobacco. He reports current alcohol use. He reports that he does not currently use drugs.   Family History:  family history is not on file.    ROS:     Review of Systems  Constitutional: Negative.   HENT: Negative.    Eyes: Negative.   Respiratory: Negative.    Gastrointestinal: Negative.   Genitourinary: Negative.   Musculoskeletal: Negative.   Skin: Negative.   Neurological: Negative.   Endo/Heme/Allergies: Negative.   Psychiatric/Behavioral: Negative.    All other systems reviewed and are negative.     All other systems are reviewed and negative.    PHYSICAL EXAM: VS:  BP 124/62   Pulse 91   Ht 5' 6 (1.676 m)   Wt 203 lb (92.1 kg)   SpO2 98%   BMI 32.77 kg/m  , BMI Body mass index is 32.77 kg/m. Last weight:  Wt Readings from Last 3 Encounters:  10/03/23 203 lb (92.1 kg)  09/15/23 199 lb 6.4 oz (90.4 kg)  09/09/23 200 lb 9.6 oz (91 kg)     Physical Exam Vitals reviewed.  Constitutional:      Appearance: Normal  appearance. He is normal weight.  HENT:     Head: Normocephalic.     Nose: Nose normal.     Mouth/Throat:     Mouth: Mucous membranes are moist.  Eyes:     Pupils: Pupils are equal, round, and reactive to light.  Cardiovascular:     Rate and Rhythm: Normal rate and regular rhythm.     Pulses: Normal pulses.     Heart sounds: Normal heart sounds.  Pulmonary:     Effort: Pulmonary effort is normal.  Abdominal:     General: Abdomen is flat. Bowel sounds are normal.  Musculoskeletal:        General: Normal range of motion.     Cervical back: Normal range of motion.  Skin:    General: Skin is warm.  Neurological:     General: No focal deficit present.     Mental Status: He is alert.  Psychiatric:        Mood and Affect: Mood normal.       EKG:   Recent Labs: 09/14/2023: ALT 20; BUN 15; Creatinine, Ser 1.16; Potassium 4.4; Sodium 141; TSH 2.060    Lipid Panel    Component Value Date/Time   CHOL 238 (H) 09/14/2023 1023   TRIG 85 09/14/2023 1023  HDL 61 09/14/2023 1023   CHOLHDL 3.9 09/14/2023 1023   LDLCALC 162 (H) 09/14/2023 1023      Other studies Reviewed: Additional studies/ records that were reviewed today include:  Review of the above records demonstrates:       No data to display            ASSESSMENT AND PLAN:    ICD-10-CM   1. Primary hypertension  I10 dapagliflozin  propanediol (FARXIGA ) 10 MG TABS tablet    2. Type 2 diabetes mellitus without complication, without long-term current use of insulin  (HCC)  E11.9 dapagliflozin  propanediol (FARXIGA ) 10 MG TABS tablet    3. Palpitations  R00.2 dapagliflozin  propanediol (FARXIGA ) 10 MG TABS tablet    4. Syncope, unspecified syncope type  R55 dapagliflozin  propanediol (FARXIGA ) 10 MG TABS tablet   Normal stress test, trace TR/MR, diastolic dysfunction, add farxiga .    5. Abnormal EKG  R94.31 dapagliflozin  propanediol (FARXIGA ) 10 MG TABS tablet   Stress test was normal. Normal LVEF       Problem  List Items Addressed This Visit       Cardiovascular and Mediastinum   HTN (hypertension) - Primary   Relevant Medications   dapagliflozin  propanediol (FARXIGA ) 10 MG TABS tablet     Endocrine   Type 2 diabetes mellitus without complication (HCC)   Relevant Medications   dapagliflozin  propanediol (FARXIGA ) 10 MG TABS tablet   Other Visit Diagnoses       Palpitations       Relevant Medications   dapagliflozin  propanediol (FARXIGA ) 10 MG TABS tablet     Syncope, unspecified syncope type       Normal stress test, trace TR/MR, diastolic dysfunction, add farxiga .   Relevant Medications   dapagliflozin  propanediol (FARXIGA ) 10 MG TABS tablet     Abnormal EKG       Stress test was normal. 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    Total time spent: 30 minutes  Signed,  Denyse Bathe, MD  10/03/2023 4:12 PM    [facility]

## 2023-10-10 ENCOUNTER — Encounter: Payer: Self-pay | Admitting: Cardiology

## 2023-10-10 ENCOUNTER — Ambulatory Visit: Admitting: Cardiology

## 2023-10-10 VITALS — BP 148/98 | HR 87 | Ht 66.0 in | Wt 198.4 lb

## 2023-10-10 DIAGNOSIS — E782 Mixed hyperlipidemia: Secondary | ICD-10-CM | POA: Insufficient documentation

## 2023-10-10 DIAGNOSIS — I10 Essential (primary) hypertension: Secondary | ICD-10-CM

## 2023-10-10 DIAGNOSIS — E119 Type 2 diabetes mellitus without complications: Secondary | ICD-10-CM

## 2023-10-10 MED ORDER — LOSARTAN POTASSIUM-HCTZ 100-25 MG PO TABS: 1.0000 | ORAL_TABLET | Freq: Every day | ORAL | 11 refills | Status: AC

## 2023-10-10 NOTE — Progress Notes (Signed)
 Established Patient Office Visit  Subjective:  Patient ID: Jason Cowan, male    DOB: October 11, 1970  Age: 53 y.o. MRN: [id number]  Chief Complaint  Patient presents with   Follow-up    1 month follow up     Patient in office for one month follow up, discuss recent lab work. Patient doing well, no complaints today. Seen by cardiology, stress test and echo done. Blood pressure medication adjusted, Farxiga  started. Patient reports he has not started Farxiga .  Discussed recent lab work. Hgb A1c elevated, pre diabetic. Discussed importance of starting Farxiga  for his heart.  LDL elevated, patient does not want to start a statin, wants to work on diet and exercise.  Blood pressure elevated today. Will increase Hyzaar.     No other concerns at this time.   Past Medical History:  Diagnosis Date   Hypertension     History reviewed. No pertinent surgical history.  Social History   Socioeconomic History   Marital status: Married    Spouse name: Not on file   Number of children: Not on file   Years of education: Not on file   Highest education level: Not on file  Occupational History   Not on file  Tobacco Use   Smoking status: Some Days    Types: Cigars   Smokeless tobacco: Never  Substance and Sexual Activity   Alcohol use: Yes   Drug use: Not Currently   Sexual activity: Not on file  Other Topics Concern   Not on file  Social History Narrative   Not on file   Social Drivers of Health   Financial Resource Strain: Not on file  Food Insecurity: Not on file  Transportation Needs: Not on file  Physical Activity: Not on file  Stress: Not on file  Social Connections: Not on file  Intimate Partner Violence: Not on file    History reviewed. No pertinent family history.  Allergies  Allergen Reactions   Penicillins Itching    Outpatient Medications Prior to Visit  Medication Sig   [DISCONTINUED] losartan -hydrochlorothiazide (HYZAAR) 50-12.5 MG tablet Take 1  tablet by mouth daily.   dapagliflozin  propanediol (FARXIGA ) 10 MG TABS tablet Take 1 tablet (10 mg total) by mouth daily before breakfast. (Patient not taking: Reported on 10/10/2023)   No facility-administered medications prior to visit.    Review of Systems  Constitutional: Negative.   HENT: Negative.    Eyes: Negative.   Respiratory: Negative.  Negative for shortness of breath.   Cardiovascular: Negative.  Negative for chest pain.  Gastrointestinal: Negative.  Negative for abdominal pain, constipation and diarrhea.  Genitourinary: Negative.   Musculoskeletal:  Negative for joint pain and myalgias.  Skin: Negative.   Neurological: Negative.  Negative for dizziness and headaches.  Endo/Heme/Allergies: Negative.   All other systems reviewed and are negative.      Objective:   BP (!) 148/98 (BP Location: Right Arm, Patient Position: Sitting, Cuff Size: Large)   Pulse 87   Ht 5' 6 (1.676 m)   Wt 198 lb 6.4 oz (90 kg)   SpO2 97%   BMI 32.02 kg/m   Vitals:   10/10/23 0926 10/10/23 0939  BP: (!) 150/110 (!) 148/98  Pulse: 87   Height: 5' 6 (1.676 m)   Weight: 198 lb 6.4 oz (90 kg)   SpO2: 97%   BMI (Calculated): 32.04     Physical Exam Nursing note reviewed.  Constitutional:      Appearance: Normal appearance. He is  normal weight.  HENT:     Head: Normocephalic and atraumatic.     Nose: Nose normal.     Mouth/Throat:     Mouth: Mucous membranes are moist.     Pharynx: Oropharynx is clear.  Eyes:     Extraocular Movements: Extraocular movements intact.     Conjunctiva/sclera: Conjunctivae normal.     Pupils: Pupils are equal, round, and reactive to light.  Cardiovascular:     Rate and Rhythm: Normal rate and regular rhythm.     Pulses: Normal pulses.     Heart sounds: Normal heart sounds.  Pulmonary:     Effort: Pulmonary effort is normal.     Breath sounds: Normal breath sounds.  Abdominal:     General: Abdomen is flat. Bowel sounds are normal.      Palpations: Abdomen is soft.  Musculoskeletal:        General: Normal range of motion.     Cervical back: Normal range of motion.  Skin:    General: Skin is warm and dry.  Neurological:     General: No focal deficit present.     Mental Status: He is alert and oriented to person, place, and time.  Psychiatric:        Mood and Affect: Mood normal.        Behavior: Behavior normal.        Thought Content: Thought content normal.        Judgment: Judgment normal.      No results found for any visits on 10/10/23.  Recent Results (from the past 2160 hours)  PSA     Status: None   Collection Time: 09/14/23 10:23 AM  Result Value Ref Range   Prostate Specific Ag, Serum 1.4 0.0 - 4.0 ng/mL    Comment: Roche ECLIA methodology. According to the American Urological Association, Serum PSA should decrease and remain at undetectable levels after radical prostatectomy. The AUA defines biochemical recurrence as an initial PSA value 0.2 ng/mL or greater followed by a subsequent confirmatory PSA value 0.2 ng/mL or greater. Values obtained with different assay methods or kits cannot be used interchangeably. Results cannot be interpreted as absolute evidence of the presence or absence of malignant disease.   Lipid panel     Status: Abnormal   Collection Time: 09/14/23 10:23 AM  Result Value Ref Range   Cholesterol, Total 238 (H) 100 - 199 mg/dL   Triglycerides 85 0 - 149 mg/dL   HDL 61 >60 mg/dL   VLDL Cholesterol Cal 15 5 - 40 mg/dL   LDL Chol Calc (NIH) 837 (H) 0 - 99 mg/dL   Chol/HDL Ratio 3.9 0.0 - 5.0 ratio    Comment:                                   T. Chol/HDL Ratio                                             Men  Women                               1/2 Avg.Risk  3.4    3.3  Avg.Risk  5.0    4.4                                2X Avg.Risk  9.6    7.1                                3X Avg.Risk 23.4   11.0   TSH     Status: None   Collection Time:  09/14/23 10:23 AM  Result Value Ref Range   TSH 2.060 0.450 - 4.500 uIU/mL  Hemoglobin A1c     Status: Abnormal   Collection Time: 09/14/23 10:23 AM  Result Value Ref Range   Hgb A1c MFr Bld 6.0 (H) 4.8 - 5.6 %    Comment:          Prediabetes: 5.7 - 6.4          Diabetes: >6.4          Glycemic control for adults with diabetes: <7.0    Est. average glucose Bld gHb Est-mCnc 126 mg/dL  [id number]     Status: None   Collection Time: 09/14/23 10:23 AM  Result Value Ref Range   Glucose 98 70 - 99 mg/dL   BUN 15 6 - 24 mg/dL   Creatinine, Ser 8.83 0.76 - 1.27 mg/dL   eGFR 75 >40 fO/fpw/8.26   BUN/Creatinine Ratio 13 9 - 20   Sodium 141 134 - 144 mmol/L   Potassium 4.4 3.5 - 5.2 mmol/L   Chloride 102 96 - 106 mmol/L   CO2 21 20 - 29 mmol/L   Calcium  9.7 8.7 - 10.2 mg/dL   Total Protein 7.4 6.0 - 8.5 g/dL   Albumin 4.5 3.8 - 4.9 g/dL   Globulin, Total 2.9 1.5 - 4.5 g/dL   Bilirubin Total 0.3 0.0 - 1.2 mg/dL   Alkaline Phosphatase 88 44 - 121 IU/L   AST 18 0 - 40 IU/L   ALT 20 0 - 44 IU/L      Assessment & Plan:  Start Farxiga  Diet and exercise to lower LDL Increase Hyzaar to 100-25  Problem List Items Addressed This Visit       Cardiovascular and Mediastinum   HTN (hypertension) - Primary   Relevant Medications   losartan -hydrochlorothiazide (HYZAAR) 100-25 MG tablet     Endocrine   Type 2 diabetes mellitus without complication (HCC)   Relevant Medications   losartan -hydrochlorothiazide (HYZAAR) 100-25 MG tablet     Other   Mixed hyperlipidemia   Relevant Medications   losartan -hydrochlorothiazide (HYZAAR) 100-25 MG tablet    Return in about 4 weeks (around 11/07/2023).   Total time spent: 25 minutes  [employer], NP  10/10/2023   This document may have been prepared by Dragon Voice Recognition software and as such may include unintentional dictation errors.

## 2023-10-14 ENCOUNTER — Ambulatory Visit: Admitting: Cardiology

## 2023-10-27 ENCOUNTER — Telehealth: Payer: Self-pay

## 2023-10-27 NOTE — Telephone Encounter (Signed)
 Pt has been informed that I've contacted the pharmacy to ask them to fill his prescription.  I noticed that he may or may not have been taken Farxiga  when I scheduled him.  He stated that he is taking it and has been advised to stop it 3 days prior to his colonoscopy.  Pt verbalized understanding.  Thanks,  [place], CMA

## 2023-10-27 NOTE — Telephone Encounter (Signed)
 Please resend prep to pharmacy. Pt didn't pick it up in Aug. The pharmacist returned it to shelf.

## 2023-11-07 ENCOUNTER — Ambulatory Visit: Admitting: Cardiology

## 2023-12-20 ENCOUNTER — Ambulatory Visit
Admission: RE | Admit: 2023-12-20 | Discharge: 2023-12-20 | Disposition: A | Discharge status: Home / Self Care | Payer: Self-pay | Attending: Gastroenterology | Admitting: Gastroenterology

## 2023-12-20 ENCOUNTER — Other Ambulatory Visit: Payer: Self-pay

## 2023-12-20 ENCOUNTER — Encounter: Payer: Self-pay | Admitting: Gastroenterology

## 2023-12-20 ENCOUNTER — Ambulatory Visit: Admitting: Anesthesiology

## 2023-12-20 ENCOUNTER — Encounter
Admission: RE | Disposition: A | Discharge status: Home / Self Care | Payer: Self-pay | Source: Home / Self Care | Attending: Gastroenterology

## 2023-12-20 DIAGNOSIS — U099 Post covid-19 condition, unspecified: Secondary | ICD-10-CM | POA: Diagnosis not present

## 2023-12-20 DIAGNOSIS — J849 Interstitial pulmonary disease, unspecified: Secondary | ICD-10-CM | POA: Diagnosis not present

## 2023-12-20 DIAGNOSIS — I10 Essential (primary) hypertension: Secondary | ICD-10-CM | POA: Diagnosis not present

## 2023-12-20 DIAGNOSIS — F17290 Nicotine dependence, other tobacco product, uncomplicated: Secondary | ICD-10-CM | POA: Insufficient documentation

## 2023-12-20 DIAGNOSIS — K635 Polyp of colon: Secondary | ICD-10-CM

## 2023-12-20 DIAGNOSIS — Z1211 Encounter for screening for malignant neoplasm of colon: Secondary | ICD-10-CM | POA: Insufficient documentation

## 2023-12-20 DIAGNOSIS — D125 Benign neoplasm of sigmoid colon: Secondary | ICD-10-CM | POA: Diagnosis not present

## 2023-12-20 DIAGNOSIS — E119 Type 2 diabetes mellitus without complications: Secondary | ICD-10-CM | POA: Insufficient documentation

## 2023-12-20 DIAGNOSIS — Z7984 Long term (current) use of oral hypoglycemic drugs: Secondary | ICD-10-CM | POA: Insufficient documentation

## 2023-12-20 DIAGNOSIS — K641 Second degree hemorrhoids: Secondary | ICD-10-CM | POA: Diagnosis not present

## 2023-12-20 HISTORY — PX: COLONOSCOPY: SHX5424

## 2023-12-20 HISTORY — PX: POLYPECTOMY: SHX149

## 2023-12-20 HISTORY — PX: HEMOSTASIS CLIP PLACEMENT: SHX6857

## 2023-12-20 SURGERY — COLONOSCOPY
Anesthesia: General

## 2023-12-20 MED ORDER — PROPOFOL 500 MG/50ML IV EMUL
INTRAVENOUS | Status: DC | PRN
  Administered 2023-12-20: 75 ug/kg/min via INTRAVENOUS

## 2023-12-20 MED ORDER — LIDOCAINE HCL (PF) 2 % IJ SOLN
INTRAMUSCULAR | Status: AC
  Filled 2023-12-20: qty 5

## 2023-12-20 MED ORDER — DEXMEDETOMIDINE HCL IN NACL 80 MCG/20ML IV SOLN
INTRAVENOUS | Status: AC
  Filled 2023-12-20: qty 40

## 2023-12-20 MED ORDER — LIDOCAINE HCL (PF) 2 % IJ SOLN
INTRAMUSCULAR | Status: AC
Start: 2023-12-20 — End: 2023-12-20
  Filled 2023-12-20: qty 30

## 2023-12-20 MED ORDER — DEXMEDETOMIDINE HCL IN NACL 80 MCG/20ML IV SOLN
INTRAVENOUS | Status: DC | PRN
  Administered 2023-12-20: 8 ug via INTRAVENOUS
  Administered 2023-12-20: 12 ug via INTRAVENOUS

## 2023-12-20 MED ORDER — SODIUM CHLORIDE 0.9 % IV SOLN: INTRAVENOUS | Status: DC

## 2023-12-20 MED ORDER — LIDOCAINE HCL (CARDIAC) PF 100 MG/5ML IV SOSY
PREFILLED_SYRINGE | INTRAVENOUS | Status: DC | PRN
  Administered 2023-12-20: 80 mg via INTRAVENOUS

## 2023-12-20 MED ORDER — PROPOFOL 10 MG/ML IV BOLUS
INTRAVENOUS | Status: DC | PRN
  Administered 2023-12-20 (×2): 50 mg via INTRAVENOUS

## 2023-12-20 NOTE — H&P (Signed)
   Rogelia Copping, MD [facility] [address] [place], [place] [address] Phone: [phone] Fax : [phone]  Primary Care Physician:  Carin Gauze, NP Primary Gastroenterologist:  Dr. Copping  Pre-Procedure History & Physical: HPI:  Jason Cowan is a 53 y.o. male is here for a screening colonoscopy.   Past Medical History:  Diagnosis Date   Hypertension     Past Surgical History:  Procedure Laterality Date   ACHILLES TENDON SURGERY Left 2005   HAND SURGERY Right 1995    Prior to Admission medications   Medication Sig Start Date End Date Taking? Authorizing Provider  dapagliflozin  propanediol (FARXIGA ) 10 MG TABS tablet Take 1 tablet (10 mg total) by mouth daily before breakfast. Patient not taking: Reported on 10/10/2023 10/03/23   Fernand Denyse LABOR, MD  losartan -hydrochlorothiazide (HYZAAR) 100-25 MG tablet Take 1 tablet by mouth daily. 10/10/23 10/09/24  Scoggins, Amber, NP    Allergies as of 09/13/2023 - Review Complete 09/13/2023  Allergen Reaction Noted   Penicillins Itching 09/09/2023    History reviewed. No pertinent family history.  Social History   Socioeconomic History   Marital status: Married    Spouse name: Not on file   Number of children: Not on file   Years of education: Not on file   Highest education level: Not on file  Occupational History   Not on file  Tobacco Use   Smoking status: Some Days    Types: Cigars   Smokeless tobacco: Never  Substance and Sexual Activity   Alcohol use: Yes   Drug use: Not Currently   Sexual activity: Not on file  Other Topics Concern   Not on file  Social History Narrative   Not on file   Social Drivers of Health   Financial Resource Strain: Not on file  Food Insecurity: Not on file  Transportation Needs: Not on file  Physical Activity: Not on file  Stress: Not on file  Social Connections: Not on file  Intimate Partner Violence: Not on file    Review of Systems: See HPI, otherwise negative  ROS  Physical Exam: BP (!) 154/103   Pulse 64   Temp (!) 96.1 F (35.6 C) (Temporal)   Resp 16   Ht 5' 6 (1.676 m)   Wt 87.5 kg   SpO2 100%   BMI 31.15 kg/m  General:   Alert,  pleasant and cooperative in NAD Head:  Normocephalic and atraumatic. Neck:  Supple; no masses or thyromegaly. Lungs:  Clear throughout to auscultation.    Heart:  Regular rate and rhythm. Abdomen:  Soft, nontender and nondistended. Normal bowel sounds, without guarding, and without rebound.   Neurologic:  Alert and  oriented x4;  grossly normal neurologically.  Impression/Plan: Jason Cowan is now here to undergo a screening colonoscopy.  Risks, benefits, and alternatives regarding colonoscopy have been reviewed with the patient.  Questions have been answered.  All parties agreeable.

## 2023-12-20 NOTE — Op Note (Signed)
 [facility] Gastroenterology Patient Name: Jason Cowan Procedure Date: 12/20/2023 10:34 AM MRN: [id number] Account #: [id number] Date of Birth: January 14, 1971 Admit Type: Outpatient Age: 53 Room: [facility] ENDO ROOM 4 Gender: Male Note Status: Finalized Instrument Name: Colon Scope [id number] Procedure:             Colonoscopy Indications:           Screening for colorectal malignant neoplasm Providers:             Rogelia Copping MD, MD Referring MD:          Jeoffrey Pollen (Referring MD) Medicines:             Propofol  per Anesthesia Complications:         No immediate complications. Procedure:             Pre-Anesthesia Assessment:                        - Prior to the procedure, a History and Physical was                         performed, and patient medications and allergies were                         reviewed. The patient's tolerance of previous                         anesthesia was also reviewed. The risks and benefits                         of the procedure and the sedation options and risks                         were discussed with the patient. All questions were                         answered, and informed consent was obtained. Prior                         Anticoagulants: The patient has taken no anticoagulant                         or antiplatelet agents. ASA Grade Assessment: II - A                         patient with mild systemic disease. After reviewing                         the risks and benefits, the patient was deemed in                         satisfactory condition to undergo the procedure.                        After obtaining informed consent, the colonoscope was                         passed under direct vision. Throughout the procedure,  the patient's blood pressure, pulse, and oxygen                         saturations were monitored continuously. The                         Colonoscope was introduced through the  anus and                         advanced to the the cecum, identified by appendiceal                         orifice and ileocecal valve. The colonoscopy was                         performed without difficulty. The patient tolerated                         the procedure well. The quality of the bowel                         preparation was good. Findings:      The perianal and digital rectal examinations were normal.      Two sessile polyps were found in the sigmoid colon. The polyps were 3 to       6 mm in size. These polyps were removed with a cold snare. Resection and       retrieval were complete. For hemostasis, one hemostatic clip was       successfully placed (MR conditional). Clip manufacturer: [employer]. There was no bleeding at the end of the procedure.      Non-bleeding internal hemorrhoids were found during retroflexion. The       hemorrhoids were Grade II (internal hemorrhoids that prolapse but reduce       spontaneously). Impression:            - Two 3 to 6 mm polyps in the sigmoid colon, removed                         with a cold snare. Resected and retrieved. Clip (MR                         conditional) was placed. Clip manufacturer: [employer].                        - Non-bleeding internal hemorrhoids. Recommendation:        - Discharge patient to home.                        - Resume previous diet.                        - Continue present medications.                        - Await pathology results.                        -  If the pathology report reveals adenomatous tissue,                         then repeat the colonoscopy for surveillance in 7                         years. Procedure Code(s):     --- Professional ---                        [phone], Colonoscopy, flexible; with removal of                         tumor(s), polyp(s), or other lesion(s) by snare                         technique Diagnosis Code(s):      --- Professional ---                        Z12.11, Encounter for screening for malignant neoplasm                         of colon                        D12.5, Benign neoplasm of sigmoid colon CPT copyright 2022 [employer]. All rights reserved. The codes documented in this report are preliminary and upon coder review may  be revised to meet current compliance requirements. Rogelia Copping MD, MD 12/20/2023 11:05:57 AM This report has been signed electronically. Number of Addenda: 0 Note Initiated On: 12/20/2023 10:34 AM Scope Withdrawal Time: 0 hours 11 minutes 39 seconds  Total Procedure Duration: 0 hours 15 minutes 38 seconds  Estimated Blood Loss:  Estimated blood loss: none.      [facility]

## 2023-12-20 NOTE — Anesthesia Preprocedure Evaluation (Addendum)
 Anesthesia Evaluation  Patient identified by MRN, date of birth, ID band Patient awake    Reviewed: Allergy & Precautions, H&P , NPO status , Patient's Chart, lab work & pertinent test results  Airway Mallampati: II  TM Distance: >3 FB Neck ROM: full    Dental no notable dental hx.    Pulmonary shortness of breath and with exertion, Current Smoker and Patient abstained from smoking. H/o PE and Interstitial lung disease post COVID 19 in 2022   Pulmonary exam normal        Cardiovascular hypertension, Normal cardiovascular exam     Neuro/Psych negative neurological ROS  negative psych ROS   GI/Hepatic negative GI ROS, Neg liver ROS,,,  Endo/Other  diabetes, Type 2    Renal/GU negative Renal ROS  negative genitourinary   Musculoskeletal   Abdominal   Peds  Hematology negative hematology ROS (+)   Anesthesia Other Findings Past Medical History: No date: Hypertension  Past Surgical History: 2005: ACHILLES TENDON SURGERY; Left 1995: HAND SURGERY; Right  BMI    Body Mass Index: 31.15 kg/m      Reproductive/Obstetrics negative OB ROS                              Anesthesia Physical Anesthesia Plan  ASA: 2  Anesthesia Plan: General   Post-op Pain Management:    Induction: Intravenous  PONV Risk Score and Plan: Propofol  infusion and TIVA  Airway Management Planned: Natural Airway  Additional Equipment:   Intra-op Plan:   Post-operative Plan:   Informed Consent: I have reviewed the patients History and Physical, chart, labs and discussed the procedure including the risks, benefits and alternatives for the proposed anesthesia with the patient or authorized representative who has indicated his/her understanding and acceptance.     Dental Advisory Given  Plan Discussed with: CRNA and Surgeon  Anesthesia Plan Comments:          Anesthesia Quick Evaluation

## 2023-12-20 NOTE — Transfer of Care (Signed)
 Immediate Anesthesia Transfer of Care Note  Patient: Jason Cowan  Procedure(s) Performed: COLONOSCOPY POLYPECTOMY, INTESTINE CONTROL OF HEMORRHAGE, GI TRACT, ENDOSCOPIC, BY CLIPPING OR OVERSEWING  Patient Location: PACU  Anesthesia Type:General  Level of Consciousness: sedated  Airway & Oxygen Therapy: Patient Spontanous Breathing  Post-op Assessment: Report given to RN and Post -op Vital signs reviewed and stable  Post vital signs: Reviewed and stable  Last Vitals:  Vitals Value Taken Time  BP    Temp    Pulse    Resp    SpO2      Last Pain:  Vitals:   12/20/23 0944  TempSrc: Temporal  PainSc: 0-No pain         Complications: No notable events documented.

## 2023-12-21 ENCOUNTER — Encounter: Payer: Self-pay | Admitting: Gastroenterology

## 2023-12-21 LAB — SURGICAL PATHOLOGY

## 2023-12-21 NOTE — Anesthesia Postprocedure Evaluation (Signed)
 Anesthesia Post Note  Patient: Jason Cowan  Procedure(s) Performed: COLONOSCOPY POLYPECTOMY, INTESTINE CONTROL OF HEMORRHAGE, GI TRACT, ENDOSCOPIC, BY CLIPPING OR OVERSEWING  Patient location during evaluation: PACU Anesthesia Type: General Level of consciousness: awake and alert Pain management: pain level controlled Vital Signs Assessment: post-procedure vital signs reviewed and stable Respiratory status: spontaneous breathing, nonlabored ventilation and respiratory function stable Cardiovascular status: blood pressure returned to baseline and stable Postop Assessment: no apparent nausea or vomiting Anesthetic complications: no   No notable events documented.   Last Vitals:  Vitals:   12/20/23 1119 12/20/23 1128  BP: (!) 99/59 105/82  Pulse: 66 (!) 59  Resp: 18 17  Temp:    SpO2: 99% 100%    Last Pain:  Vitals:   12/20/23 1128  TempSrc:   PainSc: 0-No pain                 Camellia Merilee Louder

## 2023-12-26 ENCOUNTER — Ambulatory Visit: Payer: Self-pay | Admitting: Gastroenterology
